# Patient Record
Sex: Female | Born: 1970 | Hispanic: Yes | Marital: Single | Smoking: Never smoker
Health system: Southern US, Community
[De-identification: ages and names within clinical notes are randomized; demographics above are authoritative.]

## PROBLEM LIST (undated history)

## (undated) ENCOUNTER — Inpatient Hospital Stay (HOSPITAL_COMMUNITY): Payer: Self-pay

## (undated) DIAGNOSIS — O039 Complete or unspecified spontaneous abortion without complication: Secondary | ICD-10-CM

## (undated) DIAGNOSIS — F329 Major depressive disorder, single episode, unspecified: Secondary | ICD-10-CM

## (undated) DIAGNOSIS — F32A Depression, unspecified: Secondary | ICD-10-CM

## (undated) HISTORY — PX: DILATION AND CURETTAGE OF UTERUS: SHX78

## (undated) HISTORY — PX: TUBAL LIGATION: SHX77

---

## 2008-10-03 ENCOUNTER — Ambulatory Visit: Payer: Self-pay | Admitting: Gynecology

## 2008-10-10 ENCOUNTER — Ambulatory Visit: Payer: Self-pay | Admitting: Gynecology

## 2008-10-23 ENCOUNTER — Ambulatory Visit: Payer: Self-pay | Admitting: Gynecology

## 2008-10-30 ENCOUNTER — Ambulatory Visit: Payer: Self-pay | Admitting: Gynecology

## 2009-02-20 ENCOUNTER — Inpatient Hospital Stay (HOSPITAL_COMMUNITY): Admission: AD | Admit: 2009-02-20 | Discharge: 2009-02-20 | Payer: Self-pay | Admitting: Obstetrics & Gynecology

## 2009-09-25 ENCOUNTER — Inpatient Hospital Stay (HOSPITAL_COMMUNITY): Admission: AD | Admit: 2009-09-25 | Discharge: 2009-09-25 | Payer: Self-pay | Admitting: Obstetrics

## 2009-10-12 ENCOUNTER — Inpatient Hospital Stay (HOSPITAL_COMMUNITY): Admission: RE | Admit: 2009-10-12 | Discharge: 2009-10-15 | Payer: Self-pay | Admitting: Obstetrics

## 2009-10-20 ENCOUNTER — Inpatient Hospital Stay (HOSPITAL_COMMUNITY): Admission: AD | Admit: 2009-10-20 | Discharge: 2009-10-20 | Payer: Self-pay | Admitting: Obstetrics

## 2010-11-24 LAB — CBC
HCT: 31.8 % — ABNORMAL LOW (ref 36.0–46.0)
MCHC: 32 g/dL (ref 30.0–36.0)
MCV: 77.5 fL — ABNORMAL LOW (ref 78.0–100.0)
Platelets: 331 10*3/uL (ref 150–400)
WBC: 13 10*3/uL — ABNORMAL HIGH (ref 4.0–10.5)

## 2010-11-24 LAB — URINALYSIS, ROUTINE W REFLEX MICROSCOPIC
Ketones, ur: 40 mg/dL — AB
Nitrite: NEGATIVE
Protein, ur: NEGATIVE mg/dL
Urobilinogen, UA: 0.2 mg/dL (ref 0.0–1.0)

## 2010-11-24 LAB — COMPREHENSIVE METABOLIC PANEL
Albumin: 2.4 g/dL — ABNORMAL LOW (ref 3.5–5.2)
BUN: 2 mg/dL — ABNORMAL LOW (ref 6–23)
CO2: 21 mEq/L (ref 19–32)
Calcium: 8.6 mg/dL (ref 8.4–10.5)
Chloride: 108 mEq/L (ref 96–112)
Creatinine, Ser: 0.44 mg/dL (ref 0.4–1.2)
GFR calc non Af Amer: >60 mL/min (ref >60)
Total Bilirubin: 0.7 mg/dL (ref 0.3–1.2)

## 2010-11-24 LAB — URIC ACID: Uric Acid, Serum: 3.4 mg/dL (ref 2.4–7.0)

## 2010-11-27 LAB — RPR: RPR Ser Ql: NONREACTIVE

## 2010-11-27 LAB — CBC
MCHC: 31.9 g/dL (ref 30.0–36.0)
Platelets: 416 10*3/uL — ABNORMAL HIGH (ref 150–400)
RBC: 3.89 MIL/uL (ref 3.87–5.11)
RDW: 16.7 % — ABNORMAL HIGH (ref 11.5–15.5)
WBC: 11.7 10*3/uL — ABNORMAL HIGH (ref 4.0–10.5)

## 2010-12-16 LAB — URINALYSIS, ROUTINE W REFLEX MICROSCOPIC
Glucose, UA: NEGATIVE mg/dL
Leukocytes, UA: NEGATIVE
Protein, ur: NEGATIVE mg/dL
pH: 6 (ref 5.0–8.0)

## 2010-12-16 LAB — WET PREP, GENITAL: Yeast Wet Prep HPF POC: NONE SEEN

## 2010-12-16 LAB — URINE MICROSCOPIC-ADD ON

## 2010-12-16 LAB — GC/CHLAMYDIA PROBE AMP, GENITAL: GC Probe Amp, Genital: NEGATIVE

## 2011-06-19 ENCOUNTER — Other Ambulatory Visit (HOSPITAL_COMMUNITY): Payer: Self-pay | Admitting: Obstetrics

## 2011-06-19 DIAGNOSIS — Z3689 Encounter for other specified antenatal screening: Secondary | ICD-10-CM

## 2011-06-19 DIAGNOSIS — O09529 Supervision of elderly multigravida, unspecified trimester: Secondary | ICD-10-CM

## 2011-06-25 ENCOUNTER — Ambulatory Visit (HOSPITAL_COMMUNITY)
Admission: RE | Admit: 2011-06-25 | Discharge: 2011-06-25 | Disposition: A | Discharge status: Home / Self Care | Payer: Self-pay | Source: Ambulatory Visit | Attending: Obstetrics | Admitting: Obstetrics

## 2011-06-25 ENCOUNTER — Encounter (HOSPITAL_COMMUNITY): Payer: Self-pay

## 2011-06-25 ENCOUNTER — Other Ambulatory Visit: Payer: Self-pay

## 2011-06-25 ENCOUNTER — Other Ambulatory Visit (HOSPITAL_COMMUNITY): Payer: Self-pay | Admitting: Obstetrics

## 2011-06-25 ENCOUNTER — Other Ambulatory Visit (HOSPITAL_COMMUNITY): Payer: Self-pay | Admitting: Obstetrics and Gynecology

## 2011-06-25 DIAGNOSIS — Z0489 Encounter for examination and observation for other specified reasons: Secondary | ICD-10-CM

## 2011-06-25 DIAGNOSIS — Z3689 Encounter for other specified antenatal screening: Secondary | ICD-10-CM

## 2011-06-25 DIAGNOSIS — O09529 Supervision of elderly multigravida, unspecified trimester: Secondary | ICD-10-CM | POA: Insufficient documentation

## 2011-06-25 DIAGNOSIS — Z8279 Family history of other congenital malformations, deformations and chromosomal abnormalities: Secondary | ICD-10-CM | POA: Insufficient documentation

## 2011-06-25 DIAGNOSIS — O34219 Maternal care for unspecified type scar from previous cesarean delivery: Secondary | ICD-10-CM | POA: Insufficient documentation

## 2011-06-25 NOTE — Progress Notes (Signed)
Ultrasound in AS/OBGYN/EPIC.  Follow up U/S scheduled 

## 2011-06-25 NOTE — Progress Notes (Signed)
Genetic Counseling  High-Risk Gestation Note  Appointment Date:  06/25/2011 Referred By: Kathreen Cosier, MD Date of Birth:  [birth year]  Pregnancy History: [id number] Estimated Date of Delivery: 12/13/11 Estimated Gestational Age: [redacted]w[redacted]d  [name] was seen for genetic counseling because of a maternal age of 40 y.o..  She was accompanied by two nieces, one of which provided Spanish/English interpretation. A medical language interpreter through [facility] was declined by the patient.   She was counseled regarding maternal age and the association with risk for chromosome conditions due to nondisjunction with aging of the ova.   We reviewed chromosomes, nondisjunction, and the associated 1 in  28 risk for fetal aneuploidy in the second trimester related to a maternal age of 74 at delivery.  She was counseled that the risk for aneuploidy decreases as gestational age increases, accounting for those pregnancies which spontaneously abort.  We specifically discussed Down syndrome (trisomy 33), trisomies 44 and 60, and sex chromosome aneuploidies (47,XXX and 47,XXY) including the common features and prognoses of each.   We reviewed available screening and diagnostic options.  Regarding screening tests, we discussed the options of Quad screen and ultrasound.  She understands that screening tests are used to modify a patient's a priori risk for aneuploidy, typically based on age.  This estimate provides a pregnancy specific risk assessment.  We also reviewed the availability of diagnostic option of amniocentesis. A risk of 1 in 200-300 was given for amniocentesis, the primary complication being spontaneous pregnancy loss.  We discussed the risks, limitations, and benefits of each.  After reviewing these options, [name] elected to have Quad screen at the time of today's visit and return for detailed ultrasound on 07/23/11, but declined amniocentesis at this time.  She wishes to  pursue these options to help ascertain her pregnancy specific risks for aneuploidy and will consider amniocentesis pending the results of this testing.  She understands that ultrasound cannot rule out all birth defects or genetic syndromes. The patient was advised of this limitation and states she still does not want diagnostic testing at this time.  However, she was counseled that 50-80% of fetuses with Down syndrome and up to 90% of fetuses with trisomies 13 and 18, when well visualized, have detectable anomalies or soft markers by ultrasound. An ultrasound was performed at the time of today's visit. Complete ultrasound results are reported separately.   [name] was provided with written information regarding sickle cell anemia (SCA) including the carrier frequency and incidence in the Hispanic population, the availability of carrier testing and prenatal diagnosis if indicated.  In addition, we discussed that hemoglobinopathies are routinely screened for as part of the [place] newborn screening panel.  She declined hemoglobin electrophoresis today.  Both family histories were reviewed and found to be contributory for the patient's nephew with Down syndrome (unspecified type). We discussed that 95% of cases of Down syndrome are not inherited and are the result of non-disjunction.  Three to 4% of cases of Down syndrome are the result of a translocation involving chromosome #21.  We discussed the option of chromosome analysis to determine if an individual is a carrier of a balanced translocation involving chromosome #21.  If an individual carries a balanced translocation involving chromosome #21, then the chance to have a baby with Down syndrome would be greater than the maternal age-related risk.   We discussed that given the reported family history, her nephew's Down syndrome was most likely sporadic.  However, additional information regarding his chromosome analysis is needed to accurately assess  recurrence risk for relatives.   Additionally, the patient reported a sister with a heart condition diagnosed at age 81 years described as an enlarged heart. She is otherwise healthy. The father of the pregnancy reportedly has a paternal half-brother with a heart condition at age 42. Very limited information was known regarding these individuals' conditions. We discussed that additional information is needed to assess recurrence risk for relatives. The family history is otherwise noncontributory for birth defects, mental retardation, and known genetic conditions. Without further information regarding the provided family history, an accurate genetic risk cannot be calculated. Further genetic counseling is warranted if more information is obtained.  [name]  denied exposure to environmental toxins or chemical agents. She denied the use of alcohol, tobacco or street drugs. She denied significant viral illnesses during the course of her pregnancy. Her medical and surgical histories were noncontributory.   I counseled [name]  regarding the above risks and available options.  The approximate face-to-face time with the genetic counselor was 40 minutes.  Clydie Braun Cornellius Kropp, MS, [facility] 06/25/2011

## 2011-07-08 ENCOUNTER — Telehealth (HOSPITAL_COMMUNITY): Payer: Self-pay | Admitting: MS"

## 2011-07-08 NOTE — Telephone Encounter (Signed)
Left message for patient to return call regarding results.   Quad screen performed on 06/25/11 is screen negative. Down syndrome risk reduced to 1 in 792 and Trisomy 18 risk reduced to 1 in 3,579. AFP is borderline elevated (2.07 MoM), technically screen negative. Patient has detailed ultrasound scheduled in [facility] on 07/23/11.

## 2011-07-23 ENCOUNTER — Ambulatory Visit (HOSPITAL_COMMUNITY)
Admission: RE | Admit: 2011-07-23 | Discharge: 2011-07-23 | Disposition: A | Discharge status: Home / Self Care | Payer: Self-pay | Source: Ambulatory Visit | Attending: Obstetrics | Admitting: Obstetrics

## 2011-07-23 DIAGNOSIS — O34219 Maternal care for unspecified type scar from previous cesarean delivery: Secondary | ICD-10-CM | POA: Insufficient documentation

## 2011-07-23 DIAGNOSIS — Z0489 Encounter for examination and observation for other specified reasons: Secondary | ICD-10-CM

## 2011-07-23 DIAGNOSIS — O09529 Supervision of elderly multigravida, unspecified trimester: Secondary | ICD-10-CM | POA: Insufficient documentation

## 2011-09-09 NOTE — L&D Delivery Note (Signed)
Requested to attend repeat C/S at term gestation with no reported risk factors. At birth AROM were clear and infant was manually extracted from vertex presentation. Spontaneous cries and active movement noted. Given tactile stimulation with drying and bulb suction to naso/oropharynx. No dysmorphic features. Infant made normal transition.  Eruption cyst noted on upper alveolar ridge in mouth.   Care transferred to RN from central nursery and to assigned pediatrician.   Dagoberto Ligas MD [facility]

## 2011-11-26 ENCOUNTER — Other Ambulatory Visit: Payer: Self-pay | Admitting: Obstetrics

## 2011-11-30 ENCOUNTER — Inpatient Hospital Stay (HOSPITAL_COMMUNITY)
Admission: AD | Admit: 2011-11-30 | Discharge: 2011-11-30 | Disposition: A | Discharge status: Hospice | Payer: Medicaid Other | Source: Ambulatory Visit | Attending: Obstetrics | Admitting: Obstetrics

## 2011-11-30 ENCOUNTER — Encounter (HOSPITAL_COMMUNITY): Payer: Self-pay | Admitting: *Deleted

## 2011-11-30 DIAGNOSIS — O34219 Maternal care for unspecified type scar from previous cesarean delivery: Secondary | ICD-10-CM

## 2011-11-30 DIAGNOSIS — O99891 Other specified diseases and conditions complicating pregnancy: Secondary | ICD-10-CM | POA: Insufficient documentation

## 2011-11-30 DIAGNOSIS — A084 Viral intestinal infection, unspecified: Secondary | ICD-10-CM

## 2011-11-30 DIAGNOSIS — A088 Other specified intestinal infections: Secondary | ICD-10-CM | POA: Insufficient documentation

## 2011-11-30 DIAGNOSIS — O09529 Supervision of elderly multigravida, unspecified trimester: Secondary | ICD-10-CM

## 2011-11-30 DIAGNOSIS — R197 Diarrhea, unspecified: Secondary | ICD-10-CM | POA: Insufficient documentation

## 2011-11-30 HISTORY — DX: Depression, unspecified: F32.A

## 2011-11-30 HISTORY — DX: Major depressive disorder, single episode, unspecified: F32.9

## 2011-11-30 LAB — URINALYSIS, ROUTINE W REFLEX MICROSCOPIC
Leukocytes, UA: NEGATIVE
Protein, ur: NEGATIVE mg/dL
Urobilinogen, UA: 0.2 mg/dL (ref 0.0–1.0)

## 2011-11-30 LAB — URINE MICROSCOPIC-ADD ON

## 2011-11-30 MED ORDER — DIPHENOXYLATE-ATROPINE 2.5-0.025 MG PO TABS
1.0000 | ORAL_TABLET | Freq: Once | ORAL | Status: AC
  Administered 2011-11-30: 1 via ORAL
  Filled 2011-11-30: qty 1

## 2011-11-30 NOTE — MAU Provider Note (Signed)
History     CSN: [id number]  Arrival date and time: 11/30/11 2014   First Provider Initiated Contact with Patient 11/30/11 2131      Chief Complaint  Patient presents with  . Diarrhea   HPI This is a 41 y.o. at [redacted]w[redacted]d who presents with c/o diarrhea since this morning. Had one episode of vomiting this morning but none since. States her kids had it last week.   Has irregular contractions but not painful. No leaking or bleeding. Has C/S scheduled for next week.    OB History    Grav Para Term Preterm Abortions TAB SAB Ect Mult Living   5 2 2  0 2 0 2 0 0 2      Past Medical History  Diagnosis Date  . Depression     Past Surgical History  Procedure Date  . Cesarean section     Family History  Problem Relation Age of Onset  . Down syndrome Other     Lives in [place]; unknown which type of Down syndrome  . Heart disease Sister     History  Substance Use Topics  . Smoking status: Never Smoker   . Smokeless tobacco: Not on file  . Alcohol Use: No    Allergies: No Known Allergies  Prescriptions prior to admission  Medication Sig Dispense Refill  . Prenatal Vit-Fe Fumarate-FA (PRENATAL MULTIVITAMIN) TABS Take 1 tablet by mouth every morning.        Review of Systems  Constitutional: Positive for fever and malaise/fatigue.  Gastrointestinal: Positive for nausea and diarrhea. Negative for vomiting and constipation.    Physical Exam   Blood pressure 108/60, pulse 105, temperature 100.3 F (37.9 C), resp. rate 18, height 5\' 2"  (1.575 m), weight 174 lb (78.926 kg), last menstrual period 03/08/2011.  Physical Exam  Constitutional: She is oriented to person, place, and time. She appears well-developed and well-nourished. No distress.  HENT:  Head: Normocephalic.  Cardiovascular: Normal rate.   Respiratory: Effort normal.  GI: Soft. She exhibits no distension and no mass. There is tenderness (slightly tender diffusely). There is no rebound and no guarding.    Genitourinary: Vagina normal. No vaginal discharge found.       Cervix closed/70%/-3/vtx   Musculoskeletal: Normal range of motion.  Neurological: She is alert and oriented to person, place, and time.  Skin: Skin is warm and dry.  Psychiatric: She has a normal mood and affect.   FHR baseline 145 with accels to 160s.  No decels. Average variability UCs every 7-12 minutes  Results for orders placed during the hospital encounter of 11/30/11 (from the past 24 hour(s))  URINALYSIS, ROUTINE W REFLEX MICROSCOPIC     Status: Abnormal   Collection Time   11/30/11  8:37 PM      Component Value Range   Color, Urine YELLOW  YELLOW    APPearance HAZY (*) CLEAR    Specific Gravity, Urine <1.005 (*) 1.005 - 1.030    pH 5.5  5.0 - 8.0    Glucose, UA NEGATIVE  NEGATIVE (mg/dL)   Hgb urine dipstick TRACE (*) NEGATIVE    Bilirubin Urine NEGATIVE  NEGATIVE    Ketones, ur NEGATIVE  NEGATIVE (mg/dL)   Protein, ur NEGATIVE  NEGATIVE (mg/dL)   Urobilinogen, UA 0.2  0.0 - 1.0 (mg/dL)   Nitrite NEGATIVE  NEGATIVE    Leukocytes, UA NEGATIVE  NEGATIVE   URINE MICROSCOPIC-ADD ON     Status: Abnormal   Collection Time   11/30/11  8:37 PM      Component Value Range   Squamous Epithelial / LPF FEW (*) RARE    Bacteria, UA RARE  RARE     MAU Course  Procedures  MDM Consulted Dr Tamela Oddi.  Will give one dose of Lomotil for patient comfort, but told her I do not recommend ongoing doses as virus needs to get out of her system. Labor precautions given. Will d/c home since pt is well hydrated and able to take POs.   Assessment and Plan  A:  IUP at [redacted]w[redacted]d      Previous C/s, plans repeat next week      Gastroenteritis, likely viral P:  Will d/c home after Lomotil      Supportive care at home      Labor precautions. Come back if increased pain, bleeding, leakage, decreased fetal movement.   [facility] 11/30/2011, 9:44 PM

## 2011-11-30 NOTE — MAU Note (Signed)
Per interpreter pt presents with complaint of diarrhea x 2 days, vomited x 1 this am. Her children were sick with same last week. States she was having some lower abd pain but that has gone away.

## 2011-11-30 NOTE — Discharge Instructions (Signed)
Gastroenteritis viral (Viral Gastroenteritis) La gastroenteritis viral tambin es conocida como gripe del [place]. Este trastorno [employer] y el tubo digestivo. Puede causar diarrea y vmitos repentinos. La enfermedad generalmente dura entre 3 y [address]. La [employer]son de las personas desarrolla una respuesta inmunolgica. Con el tiempo, esto elimina el virus. Mientras se desarrolla esta respuesta natural, el virus puede afectar en forma importante su salud.  CAUSAS Muchos virus diferentes pueden causar gastroenteritis, por ejemplo el rotavirus o el norovirus. Estos virus pueden contagiarse al consumir alimentos o agua contaminados. Tambin puede contagiarse al compartir utensilios u otros artculos personales con una persona infectada o al tocar una superficie contaminada.  SNTOMAS Los sntomas ms comunes son diarrea y vmitos. Estos problemas pueden causar una prdida grave de lquidos corporales(deshidratacin) y un desequilibrio de sales corporales(electrolitos). Otros sntomas pueden ser:   Grant Ruts.   Dolor de [place].   Fatiga.   Dolor abdominal.  DIAGNSTICO  El mdico podr hacer el diagnstico de gastroenteritis viral basndose en los sntomas y el examen fsico Tambin pueden tomarle una muestra de materia fecal para diagnosticar la presencia de virus u otras infecciones.  TRATAMIENTO Esta enfermedad generalmente desaparece sin tratamiento. Los tratamientos estn dirigidos a Social research officer, government. Los casos ms graves de gastroenteritis viral implican vmitos tan intensos que no es posible retener lquidos. En [employer], los lquidos deben administrarse a travs de una va intravenosa (IV).  INSTRUCCIONES PARA EL CUIDADO DOMICILIARIO  Beba suficientes lquidos para mantener la orina clara o de color amarillo plido. Beba pequeas cantidades de lquido con frecuencia y aumente la cantidad segn la tolerancia.   Pida instrucciones especficas a su mdico con respecto a la  rehidratacin.   Evite:   Alimentos que Nurse, adult.   Alcohol.   Gaseosas.   TabacoVista Lawman.   Bebidas con cafena.   Lquidos muy calientes o fros.   Alimentos muy grasos.   Comer demasiado a Licensed conveyancer.   Productos lcteos hasta 24 a 48 horas despus de que se detenga la diarrea.   Puede consumir probiticos. Los probiticos son cultivos activos de bacterias beneficiosas. Pueden disminuir la cantidad y el nmero de deposiciones diarreicas en el adulto. Se encuentran en los yogures con cultivos activos y en los suplementos.   Lave bien sus manos para evitar que se disemine el virus.   Slo tome medicamentos de venta libre o recetados para Primary school teacher, las molestias o bajar la fiebre segn las indicaciones de su mdico. No administre aspirina a los nios. Los medicamentos antidiarreicos no son recomendables.   Consulte a su mdico si puede seguir tomando sus medicamentos recetados o de [employer].   Cumpla con todas las visitas de control, segn le indique su mdico.  SOLICITE ATENCIN MDICA DE INMEDIATO SI:  No puede retener lquidos.   No hay emisin de orina durante 6 a 8 horas.   Le falta el aire.   Observa sangre en el vmito (se ve como caf molido) o en la materia fecal.   Siente dolor abdominal que empeora o se concentra en una zona pequea (se localiza).   Tiene nuseas o vmitos persistentes.   Tiene fiebre.   El paciente es un nio menor de 3 meses y [place].   El paciente es un nio mayor de 3 meses, tiene fiebre y sntomas persistentes.   El paciente es un nio mayor de 3 meses y tiene fiebre y sntomas que empeoran repentinamente.   El [place] es  un beb y no tiene lgrimas cuando llora.  ASEGRESE QUE:   Comprende estas instrucciones.   Controlar su enfermedad.   Solicitar ayuda inmediatamente si no mejora o si empeora.  Document Released: 08/25/2005 Document Revised: 08/14/2011 [facility] Patient Information 2012  [place], [place].Parto por cesrea  (Cesarean Delivery) El parto por cesrea es el nacimiento de un feto a travs de un corte (incisin) en el vientre (abdomen) y en el matriz (tero).  HGALE SABER A SU MDICO SOBRE:   Complicaciones del embarazo.   Alergias.   Medicamentos que [place], incluyendo hierbas, gotas oftlmicas, medicamentos de [place] y Control and instrumentation engineer.   Uso de corticoides (por va oral o cremas).   Problemas anteriores debido a anestsicos o a medicamentos que [employer] sensibilidad.   Cirugas anteriores.   Historia de cogulos sanguneos   Problemas hemorrgicos o en la sangre.   Otros problemas de [place].  RIESGOS Y COMPLICACIONES  Hemorragias.   Infeccin.   Cogulos sanguneos.   Lesin en los rganos circundantes.   Problemas con la anestesia.   Lesiones al beb.  ANTES DEL PROCEDIMIENTO   Le insertarn un tubo (catter [employer] vejiga. El catter Foley drena la orina desde la vejiga hacia [place]. Esto mantendr la vejiga vaca durante la [place].   Le colocarn un catter de acceso intravenoso (IV) en el brazo.   Luego rasurarn la zona del pubis y la parte inferior del abdomen. Esto se realiza para evitar una infeccin en el sitio de la incisin.   Le administrarn un medicamento anticido. Esto impedir que los contenidos cidos del estmago ingresen a los pulmones si vomita durante el procedimiento.   Le podrn dar antibiticos para prevenir infecciones.  PROCEDIMIENTO   Le administrarn un medicamento para adormecer a zona inferior del cuerpo anestesia regional). Si estuviera en trabajo de parto, podrn aplicarle una anestesia epidural, que se utiliza tanto el el [place] de parto como en la cesrea. Puede ser [employer] administren un medicamento que la har dormir (anestesia general), aunque esto no es tan frecuente.   Le harn una incisin en el abdomen que se extiende [employer] and Company. Hay dos tipos bsicos de incisin:   La incisin  horizontal (transversa) Las incisiones horizontales se Engineer, maintenance mayor parte de las cesreas de [place].   La incisin vertical (de Jolene Schimke). Esta es la menos frecuente. Se reserva para aquellas mujeres que tienen una complicacin grave (parto prematuro extremo) o bajo situaciones de Associate Professor.   Las incisiones horizontales y Garment/textile technologist pueden utilizarse ambas al [employer]. Sin embargo, no es Air traffic controller.   El beb nacer.  DESPUS DEL PROCEDIMIENTO   Si estuvo despierta durante la [place], podr ver al beb enseguida. Si la duermen, ver al beb tan pronto como despierte.   Podr amamantar a su beb despus del procedimiento.   Podr levantarse y Therapist, art mismo da de la [place]. Si Office manager en cama durante cierto tiempo, recibir ayuda para darse vuelta, toser y Industrial/product designer profundamente despus de la ciruga. Esto ayuda a Environmental health practitioner en los pulmones, como la neumona.   No se levante de la cama sola la primera vez luego de la [place]. Necesitar ayuda para levantarse de la cama hasta que pueda hacerlo sola.   Podr darse Shaune Spittle el da siguiente a la [place]. Despus que le quiten el vendaje, un enfermero la ayudar a ducharse.   Tendr unas medias compresivas neumticas en los pies o en la zona inferior de las  piernas. Se colocan para prevenir cogulos sanguneos. Cuando se levante y camine regularmente, ya no sern necesarias.   Nocruce las piernas al sentarse.   Si elimina cogulos de sangre, gurdelos. Si elimina un cogulo cuando va al bao, por favor no tire la cadena. Llame al enfermero. Comunquele al enfermero si piensa que tiene demasiada hemorragia o que elimina muchos cogulos.   Comience a beber lquidos y a alimentarse segn las indicaciones del mdico. Si el estmago no est en condiciones,comer y beber demasiado pronto puede causar aumento de la hinchazn y la inflamacin del intestino o el abdomen. Esto es [employer].   Le  darn medicamentos si los necesita. Dgale a los profesionales si siente dolor. Ellos tratarn de que se sienta cmoda. Tambin le indicarn antibiticos para prevenir una infeccin.   Le quitarn la va intravenosa cuando beba una cantidad razonable de lquido. El catter Foley se retirar cuando se levante y camine.   Si su tipo sanguneo es Rh negativo y el beb es Rh positivo, le darn una inyeccin de inmunoblobulina anti D. Esta inyeccin evita que tenga problemas con el Rh en embarazos futuros. Deber colocarse la inyeccin an si se ha Production assistant, radio las trompas.   Si le permiten llevar al beb a dar un paseo,colquelo en la cunita y empjela. No lleve al beb en sus brazos.  Document Released: 08/25/2005 Document Revised: 08/14/2011 [facility] Patient Information 2012 [place], [place].

## 2011-12-01 ENCOUNTER — Encounter (HOSPITAL_COMMUNITY): Payer: Self-pay

## 2011-12-02 ENCOUNTER — Encounter (HOSPITAL_COMMUNITY): Payer: Self-pay | Admitting: Pharmacy Technician

## 2011-12-02 ENCOUNTER — Encounter (HOSPITAL_COMMUNITY): Payer: Self-pay

## 2011-12-02 ENCOUNTER — Encounter (HOSPITAL_COMMUNITY)
Admission: RE | Admit: 2011-12-02 | Discharge: 2011-12-02 | Disposition: A | Discharge status: Home / Self Care | Payer: Medicaid Other | Source: Ambulatory Visit | Attending: Obstetrics | Admitting: Obstetrics

## 2011-12-02 VITALS — BP 105/50 | HR 63 | Ht 62.0 in | Wt 174.0 lb

## 2011-12-02 DIAGNOSIS — O09529 Supervision of elderly multigravida, unspecified trimester: Secondary | ICD-10-CM

## 2011-12-02 DIAGNOSIS — O34219 Maternal care for unspecified type scar from previous cesarean delivery: Secondary | ICD-10-CM

## 2011-12-02 HISTORY — DX: Complete or unspecified spontaneous abortion without complication: O03.9

## 2011-12-02 LAB — CBC
HCT: 38.6 % (ref 36.0–46.0)
Hemoglobin: 13 g/dL (ref 12.0–15.0)
MCH: 28.6 pg (ref 26.0–34.0)
MCV: 85 fL (ref 78.0–100.0)
RBC: 4.54 MIL/uL (ref 3.87–5.11)
WBC: 7.8 10*3/uL (ref 4.0–10.5)

## 2011-12-02 LAB — SURGICAL PCR SCREEN
MRSA, PCR: NEGATIVE
Staphylococcus aureus: NEGATIVE

## 2011-12-02 NOTE — Patient Instructions (Addendum)
   Your procedure is scheduled on:  Wednesday, April 3rd  Enter through the [employer] of [facility] at:  [employer] up the phone at the desk and dial [phone] and inform us of your arrival.  Please call this number if you have any problems the morning of surgery: [phone]  Remember: Do not eat food after midnight: Tuesday Do not drink clear liquids after:  Tuesday Take these medicines the morning of surgery with a SIP OF WATER:  None  Do not wear jewelry, make-up, or FINGER nail polish Do not wear lotions, powders, perfumes or deodorant. Do not shave 48 hours prior to surgery. Do not bring valuables to the hospital. Contacts, dentures or bridgework may not be worn into surgery.  Leave suitcase in the car. After Surgery it may be brought to your room. For patients being admitted to the hospital, checkout time is 11:00am the day of discharge.  Patients discharged on the day of surgery will not be allowed to drive home.  Home with Dante Gang   Remember to use your hibiclens as instructed.Please shower with 1/2 bottle the evening before your surgery and the other 1/2 bottle the morning of surgery. Neck down avoiding private area.

## 2011-12-02 NOTE — Pre-Procedure Instructions (Signed)
Eda Royal, Spanish Interpreter used at [employer].

## 2011-12-05 NOTE — H&P (Unsigned)
NAME:  FANY, CAVANAUGH NO.:  [id number]  MEDICAL RECORD NO.:  [id number]  LOCATION:  PERIO                         FACILITY:  [facility]  PHYSICIAN:  Kathreen Cosier, M.D.DATE OF BIRTH:  07-05-1971  DATE OF ADMISSION:  11/06/2011 DATE OF DISCHARGE:                             HISTORY & PHYSICAL   The patient is a 41 year old, gravida 5, para 2-0-2-2, who has a history of 2 previous C-sections.  Her EDC is December 13, 2011, and she is now admitted for repeat C-section and tubal ligation,.  PAST MEDICAL HISTORY:  She has a history of depression, on Zoloft 50 mg daily.  PAST SURGICAL HISTORY:  Two C-sections.  SYSTEM REVIEW:  Negative.  PHYSICAL EXAMINATION:  GENERAL:  A well-developed female, in no distress. HEENT:  Negative. BREASTS:  Engorged. LUNGS:  Clear. HEART:  Regular rhythm.  No murmurs, no gallops. ABDOMEN:  Term. PELVIC:  Cervix long and closed. EXTREMITIES:  Negative.          ______________________________ Kathreen Cosier, M.D.     BAM/MEDQ  D:  12/04/2011  T:  12/05/2011  Job:  [id number]

## 2011-12-10 ENCOUNTER — Encounter (HOSPITAL_COMMUNITY)
Admission: RE | Disposition: A | Discharge status: Home / Self Care | Payer: Self-pay | Source: Ambulatory Visit | Attending: Obstetrics

## 2011-12-10 ENCOUNTER — Inpatient Hospital Stay (HOSPITAL_COMMUNITY): Payer: Medicaid Other | Admitting: Anesthesiology

## 2011-12-10 ENCOUNTER — Inpatient Hospital Stay (HOSPITAL_COMMUNITY)
Admission: RE | Admit: 2011-12-10 | Discharge: 2011-12-13 | DRG: 766 | Disposition: A | Discharge status: Home / Self Care | Payer: Medicaid Other | Source: Ambulatory Visit | Attending: Obstetrics | Admitting: Obstetrics

## 2011-12-10 ENCOUNTER — Encounter (HOSPITAL_COMMUNITY): Payer: Self-pay | Admitting: *Deleted

## 2011-12-10 ENCOUNTER — Encounter (HOSPITAL_COMMUNITY): Payer: Self-pay | Admitting: Anesthesiology

## 2011-12-10 DIAGNOSIS — Z302 Encounter for sterilization: Secondary | ICD-10-CM

## 2011-12-10 DIAGNOSIS — O34219 Maternal care for unspecified type scar from previous cesarean delivery: Principal | ICD-10-CM | POA: Diagnosis present

## 2011-12-10 DIAGNOSIS — Z8279 Family history of other congenital malformations, deformations and chromosomal abnormalities: Secondary | ICD-10-CM

## 2011-12-10 DIAGNOSIS — Z98891 History of uterine scar from previous surgery: Secondary | ICD-10-CM

## 2011-12-10 DIAGNOSIS — O09529 Supervision of elderly multigravida, unspecified trimester: Secondary | ICD-10-CM

## 2011-12-10 LAB — TYPE AND SCREEN
ABO/RH(D): A POS
Antibody Screen: NEGATIVE

## 2011-12-10 SURGERY — Surgical Case
Anesthesia: Spinal | Laterality: Bilateral

## 2011-12-10 MED ORDER — SENNOSIDES-DOCUSATE SODIUM 8.6-50 MG PO TABS
2.0000 | ORAL_TABLET | Freq: Every day | ORAL | Status: DC
  Administered 2011-12-10 – 2011-12-12 (×3): 2 via ORAL

## 2011-12-10 MED ORDER — ONDANSETRON HCL 4 MG/2ML IJ SOLN: 4.0000 mg | Freq: Three times a day (TID) | INTRAMUSCULAR | Status: DC | PRN

## 2011-12-10 MED ORDER — KETOROLAC TROMETHAMINE 30 MG/ML IJ SOLN: 15.0000 mg | Freq: Once | INTRAMUSCULAR | Status: DC | PRN

## 2011-12-10 MED ORDER — LANOLIN HYDROUS EX OINT: 1.0000 "application " | TOPICAL_OINTMENT | CUTANEOUS | Status: DC | PRN

## 2011-12-10 MED ORDER — ONDANSETRON HCL 4 MG PO TABS: 4.0000 mg | ORAL_TABLET | ORAL | Status: DC | PRN

## 2011-12-10 MED ORDER — OXYTOCIN 10 UNIT/ML IJ SOLN
INTRAMUSCULAR | Status: AC
  Filled 2011-12-10: qty 2

## 2011-12-10 MED ORDER — KETOROLAC TROMETHAMINE 30 MG/ML IJ SOLN
INTRAMUSCULAR | Status: AC
  Administered 2011-12-10: 30 mg via INTRAVENOUS
  Filled 2011-12-10: qty 1

## 2011-12-10 MED ORDER — MORPHINE SULFATE 0.5 MG/ML IJ SOLN
INTRAMUSCULAR | Status: AC
  Filled 2011-12-10: qty 10

## 2011-12-10 MED ORDER — SCOPOLAMINE 1 MG/3DAYS TD PT72
1.0000 | MEDICATED_PATCH | Freq: Once | TRANSDERMAL | Status: DC
  Administered 2011-12-10: 1.5 mg via TRANSDERMAL

## 2011-12-10 MED ORDER — SIMETHICONE 80 MG PO CHEW
80.0000 mg | CHEWABLE_TABLET | Freq: Three times a day (TID) | ORAL | Status: DC
  Administered 2011-12-10 – 2011-12-13 (×10): 80 mg via ORAL

## 2011-12-10 MED ORDER — OXYTOCIN 20 UNITS IN LACTATED RINGERS INFUSION - SIMPLE
INTRAVENOUS | Status: AC
  Administered 2011-12-10: 125 mL/h via INTRAVENOUS
  Filled 2011-12-10: qty 1000

## 2011-12-10 MED ORDER — OXYCODONE-ACETAMINOPHEN 5-325 MG PO TABS
1.0000 | ORAL_TABLET | ORAL | Status: DC | PRN
  Administered 2011-12-11 – 2011-12-12 (×7): 1 via ORAL
  Administered 2011-12-12: 2 via ORAL
  Administered 2011-12-12: 1 via ORAL
  Administered 2011-12-13: 2 via ORAL
  Administered 2011-12-13: 1 via ORAL
  Filled 2011-12-10: qty 1
  Filled 2011-12-10: qty 2
  Filled 2011-12-10 (×6): qty 1
  Filled 2011-12-10: qty 2
  Filled 2011-12-10: qty 1
  Filled 2011-12-10: qty 2

## 2011-12-10 MED ORDER — EPHEDRINE SULFATE 50 MG/ML IJ SOLN
INTRAMUSCULAR | Status: DC | PRN
  Administered 2011-12-10: 10 mg via INTRAVENOUS

## 2011-12-10 MED ORDER — MEPERIDINE HCL 25 MG/ML IJ SOLN: 6.2500 mg | INTRAMUSCULAR | Status: DC | PRN

## 2011-12-10 MED ORDER — PRENATAL MULTIVITAMIN CH
1.0000 | ORAL_TABLET | Freq: Every day | ORAL | Status: DC
  Administered 2011-12-11 – 2011-12-13 (×3): 1 via ORAL
  Filled 2011-12-10 (×3): qty 1

## 2011-12-10 MED ORDER — NALOXONE HCL 0.4 MG/ML IJ SOLN: 0.4000 mg | INTRAMUSCULAR | Status: DC | PRN

## 2011-12-10 MED ORDER — ONDANSETRON HCL 4 MG/2ML IJ SOLN
INTRAMUSCULAR | Status: AC
  Filled 2011-12-10: qty 2

## 2011-12-10 MED ORDER — ZOLPIDEM TARTRATE 5 MG PO TABS: 5.0000 mg | ORAL_TABLET | Freq: Every evening | ORAL | Status: DC | PRN

## 2011-12-10 MED ORDER — KETOROLAC TROMETHAMINE 30 MG/ML IJ SOLN: 30.0000 mg | Freq: Four times a day (QID) | INTRAMUSCULAR | Status: AC | PRN

## 2011-12-10 MED ORDER — WITCH HAZEL-GLYCERIN EX PADS: 1.0000 "application " | MEDICATED_PAD | CUTANEOUS | Status: DC | PRN

## 2011-12-10 MED ORDER — CEFAZOLIN SODIUM 1-5 GM-% IV SOLN
1.0000 g | Freq: Once | INTRAVENOUS | Status: AC
  Administered 2011-12-10: 1 g via INTRAVENOUS

## 2011-12-10 MED ORDER — IBUPROFEN 600 MG PO TABS
600.0000 mg | ORAL_TABLET | Freq: Four times a day (QID) | ORAL | Status: DC
  Administered 2011-12-10 – 2011-12-13 (×11): 600 mg via ORAL
  Filled 2011-12-10 (×11): qty 1

## 2011-12-10 MED ORDER — NALBUPHINE HCL 10 MG/ML IJ SOLN
5.0000 mg | INTRAMUSCULAR | Status: DC | PRN
  Administered 2011-12-10: 10 mg via INTRAVENOUS
  Filled 2011-12-10 (×3): qty 1

## 2011-12-10 MED ORDER — PROMETHAZINE HCL 25 MG/ML IJ SOLN: 6.2500 mg | INTRAMUSCULAR | Status: DC | PRN

## 2011-12-10 MED ORDER — FENTANYL CITRATE 0.05 MG/ML IJ SOLN
INTRAMUSCULAR | Status: AC
  Filled 2011-12-10: qty 2

## 2011-12-10 MED ORDER — SODIUM CHLORIDE 0.9 % IJ SOLN: 3.0000 mL | INTRAMUSCULAR | Status: DC | PRN

## 2011-12-10 MED ORDER — ONDANSETRON HCL 4 MG/2ML IJ SOLN
INTRAMUSCULAR | Status: DC | PRN
  Administered 2011-12-10: 4 mg via INTRAVENOUS

## 2011-12-10 MED ORDER — OXYTOCIN 20 UNITS IN LACTATED RINGERS INFUSION - SIMPLE
125.0000 mL/h | INTRAVENOUS | Status: AC
  Administered 2011-12-10: 125 mL/h via INTRAVENOUS

## 2011-12-10 MED ORDER — LACTATED RINGERS IV SOLN
INTRAVENOUS | Status: DC
  Administered 2011-12-10 (×2): via INTRAVENOUS

## 2011-12-10 MED ORDER — DIPHENHYDRAMINE HCL 25 MG PO CAPS
25.0000 mg | ORAL_CAPSULE | ORAL | Status: DC | PRN
  Administered 2011-12-11: 25 mg via ORAL
  Filled 2011-12-10 (×2): qty 1

## 2011-12-10 MED ORDER — EPHEDRINE 5 MG/ML INJ
INTRAVENOUS | Status: AC
  Filled 2011-12-10: qty 10

## 2011-12-10 MED ORDER — TETANUS-DIPHTH-ACELL PERTUSSIS 5-2.5-18.5 LF-MCG/0.5 IM SUSP
0.5000 mL | Freq: Once | INTRAMUSCULAR | Status: AC
  Administered 2011-12-11: 0.5 mL via INTRAMUSCULAR
  Filled 2011-12-10: qty 0.5

## 2011-12-10 MED ORDER — ONDANSETRON HCL 4 MG/2ML IJ SOLN: 4.0000 mg | INTRAMUSCULAR | Status: DC | PRN

## 2011-12-10 MED ORDER — NALBUPHINE HCL 10 MG/ML IJ SOLN
5.0000 mg | INTRAMUSCULAR | Status: DC | PRN
  Administered 2011-12-10 – 2011-12-11 (×2): 10 mg via SUBCUTANEOUS
  Filled 2011-12-10 (×2): qty 1

## 2011-12-10 MED ORDER — KETOROLAC TROMETHAMINE 60 MG/2ML IM SOLN
60.0000 mg | Freq: Once | INTRAMUSCULAR | Status: AC | PRN
  Administered 2011-12-10: 60 mg via INTRAMUSCULAR
  Filled 2011-12-10: qty 2

## 2011-12-10 MED ORDER — DIPHENHYDRAMINE HCL 50 MG/ML IJ SOLN: 12.5000 mg | INTRAMUSCULAR | Status: DC | PRN

## 2011-12-10 MED ORDER — SIMETHICONE 80 MG PO CHEW
80.0000 mg | CHEWABLE_TABLET | ORAL | Status: DC | PRN
  Administered 2011-12-12: 80 mg via ORAL

## 2011-12-10 MED ORDER — DIPHENHYDRAMINE HCL 50 MG/ML IJ SOLN: 25.0000 mg | INTRAMUSCULAR | Status: DC | PRN

## 2011-12-10 MED ORDER — OXYTOCIN 20 UNITS IN LACTATED RINGERS INFUSION - SIMPLE
INTRAVENOUS | Status: DC | PRN
  Administered 2011-12-10: 20 [IU] via INTRAVENOUS

## 2011-12-10 MED ORDER — LACTATED RINGERS IV SOLN
INTRAVENOUS | Status: DC
  Administered 2011-12-10: 17:00:00 via INTRAVENOUS

## 2011-12-10 MED ORDER — MENTHOL 3 MG MT LOZG: 1.0000 | LOZENGE | OROMUCOSAL | Status: DC | PRN

## 2011-12-10 MED ORDER — SODIUM CHLORIDE 0.9 % IV SOLN
1.0000 ug/kg/h | INTRAVENOUS | Status: DC | PRN
  Filled 2011-12-10: qty 2.5

## 2011-12-10 MED ORDER — HYDROMORPHONE HCL PF 1 MG/ML IJ SOLN: 0.2500 mg | INTRAMUSCULAR | Status: DC | PRN

## 2011-12-10 MED ORDER — KETOROLAC TROMETHAMINE 30 MG/ML IJ SOLN
30.0000 mg | Freq: Four times a day (QID) | INTRAMUSCULAR | Status: AC | PRN
  Administered 2011-12-10: 30 mg via INTRAVENOUS

## 2011-12-10 MED ORDER — DIBUCAINE 1 % RE OINT: 1.0000 "application " | TOPICAL_OINTMENT | RECTAL | Status: DC | PRN

## 2011-12-10 MED ORDER — DIPHENHYDRAMINE HCL 25 MG PO CAPS: 25.0000 mg | ORAL_CAPSULE | Freq: Four times a day (QID) | ORAL | Status: DC | PRN

## 2011-12-10 SURGICAL SUPPLY — 27 items
CLOTH BEACON ORANGE TIMEOUT ST (SAFETY) ×2 IMPLANT
CONTAINER PREFILL 10% NBF 15ML (MISCELLANEOUS) ×4 IMPLANT
DERMABOND ADVANCED (GAUZE/BANDAGES/DRESSINGS) ×1
DERMABOND ADVANCED .7 DNX12 (GAUZE/BANDAGES/DRESSINGS) ×1 IMPLANT
ELECT REM PT RETURN 9FT ADLT (ELECTROSURGICAL) ×2
ELECTRODE REM PT RTRN 9FT ADLT (ELECTROSURGICAL) ×1 IMPLANT
EXTRACTOR VACUUM M CUP 4 TUBE (SUCTIONS) IMPLANT
GLOVE BIO SURGEON STRL SZ8.5 (GLOVE) ×4 IMPLANT
GOWN PREVENTION PLUS LG XLONG (DISPOSABLE) ×4 IMPLANT
GOWN PREVENTION PLUS XXLARGE (GOWN DISPOSABLE) ×2 IMPLANT
KIT ABG SYR 3ML LUER SLIP (SYRINGE) IMPLANT
NEEDLE HYPO 25X5/8 SAFETYGLIDE (NEEDLE) IMPLANT
NS IRRIG 1000ML POUR BTL (IV SOLUTION) ×2 IMPLANT
PACK C SECTION WH (CUSTOM PROCEDURE TRAY) ×2 IMPLANT
SLEEVE SCD COMPRESS KNEE MED (MISCELLANEOUS) IMPLANT
SUT CHROMIC 0 CT 802H (SUTURE) ×2 IMPLANT
SUT CHROMIC 1 CTX 36 (SUTURE) ×4 IMPLANT
SUT CHROMIC 2 0 SH (SUTURE) ×2 IMPLANT
SUT GUT PLAIN 0 CT-3 TAN 27 (SUTURE) ×2 IMPLANT
SUT MON AB 4-0 PS1 27 (SUTURE) ×2 IMPLANT
SUT VIC AB 0 CT1 18XCR BRD8 (SUTURE) IMPLANT
SUT VIC AB 0 CT1 8-18 (SUTURE)
SUT VIC AB 0 CTX 36 (SUTURE) ×2
SUT VIC AB 0 CTX36XBRD ANBCTRL (SUTURE) ×2 IMPLANT
TOWEL OR 17X24 6PK STRL BLUE (TOWEL DISPOSABLE) ×4 IMPLANT
TRAY FOLEY CATH 14FR (SET/KITS/TRAYS/PACK) ×2 IMPLANT
WATER STERILE IRR 1000ML POUR (IV SOLUTION) ×2 IMPLANT

## 2011-12-10 NOTE — Anesthesia Preprocedure Evaluation (Signed)
Anesthesia Evaluation  Patient identified by MRN, date of birth, ID band Patient awake    Reviewed: Allergy & Precautions, H&P , NPO status , Patient's Chart, lab work & pertinent test results  Airway Mallampati: II TM Distance: >3 FB Neck ROM: full    Dental No notable dental hx.    Pulmonary neg pulmonary ROS,    Pulmonary exam normal       Cardiovascular negative cardio ROS  Rate:Normal     Neuro/Psych PSYCHIATRIC DISORDERS Depression negative neurological ROS     GI/Hepatic negative GI ROS, Neg liver ROS,   Endo/Other  negative endocrine ROS  Renal/GU negative Renal ROS  negative genitourinary   Musculoskeletal negative musculoskeletal ROS (+)   Abdominal Normal abdominal exam  (+)   Peds  Hematology negative hematology ROS (+)   Anesthesia Other Findings   Reproductive/Obstetrics (+) Pregnancy                           Anesthesia Physical Anesthesia Plan  ASA: II  Anesthesia Plan: Spinal   Post-op Pain Management:    Induction:   Airway Management Planned:   Additional Equipment:   Intra-op Plan:   Post-operative Plan:   Informed Consent: I have reviewed the patients History and Physical, chart, labs and discussed the procedure including the risks, benefits and alternatives for the proposed anesthesia with the patient or authorized representative who has indicated his/her understanding and acceptance.     Plan Discussed with: Surgeon and CRNA  Anesthesia Plan Comments:         Anesthesia Quick Evaluation

## 2011-12-10 NOTE — Anesthesia Postprocedure Evaluation (Signed)
  Anesthesia Post Note  Patient: Leslie Mcdowell  Procedure(s) Performed: Procedure(s) (LRB): CESAREAN SECTION WITH BILATERAL TUBAL LIGATION (Bilateral)  Anesthesia type: Spinal  Patient location: Mother/Baby  Post pain: Pain level controlled  Post assessment: Post-op Vital signs reviewed  Last Vitals:  Filed Vitals:   12/10/11 1539  BP: 99/64  Pulse: 79  Temp:   Resp: 18    Post vital signs: Reviewed  Level of consciousness: awake  Complications: No apparent anesthesia complications

## 2011-12-10 NOTE — Transfer of Care (Signed)
Immediate Anesthesia Transfer of Care Note  Patient: Leslie Mcdowell  Procedure(s) Performed: Procedure(s) (LRB): CESAREAN SECTION WITH BILATERAL TUBAL LIGATION (Bilateral)  Patient Location: PACU  Anesthesia Type: Spinal  Level of Consciousness: awake  Airway & Oxygen Therapy: Patient Spontanous Breathing  Post-op Assessment: Report given to PACU RN  Post vital signs: Reviewed and stable  Complications: No apparent anesthesia complications

## 2011-12-10 NOTE — Op Note (Signed)
And preop diagnosis previous cesarean section at term multiparity postop diagnosis repeat C-section and tubal ligation Anesthesia spinal Present assistant Dr. Coral Ceo Surgeon Dr. Francoise Ceo Procedure patient placed on the operating table in supine position after spinal and and is to abdomen prepped and draped bladder and to the Foley catheter a transverse suprapubic incision made through the old scar carried him to the rectus fascia fascia cleaned and incised the length of the incision recti muscles retracted laterally peritoneum incised longitudinally transverse incision made in the visceroperitoneum above the bladder bladder mobilized inferiorly transverse low uterine incision made the fluid was clear patient delivered from the OA position of a female Apgar 9 and 9 the team was in attendance the placenta was posterior more minute and sent to labor and delivery uterine cavity clean with dry laps uterine incision closed in one layer with continuous within normal on chromic hemostasis satisfactory bladder flap reattached to a chromic uterus well contracted tubes and ovaries normal the right tube GRAS in the midportion a Babcock clamp 0 plain suture placed in the nasal salpinx below the portion of tube and this was tied an approximately 1 inch of tube transected proceeded in a similar fashion incised lap and sponge counts correct abdomen chosen as peritoneum continuous with 2-0 chromic fascia continuous with of 0 Dexon skin shows a subcuticular stitch of 4-0 Monocryl blood loss 600 cc patient tolerated procedure well

## 2011-12-10 NOTE — Anesthesia Procedure Notes (Signed)

## 2011-12-10 NOTE — Transfer of Care (Deleted)
Immediate Anesthesia Transfer of Care Note  Patient: Leslie Mcdowell  Procedure(s) Performed: Procedure(s) (LRB): CESAREAN SECTION WITH BILATERAL TUBAL LIGATION (Bilateral)  Patient Location: PACU  Anesthesia Type: Spinal  Level of Consciousness: awake, alert  and oriented  Airway & Oxygen Therapy: Patient Spontanous Breathing  Post-op Assessment: Report given to PACU RN and Post -op Vital signs reviewed and stable  Post vital signs: stable  Complications: No apparent anesthesia complications

## 2011-12-10 NOTE — Anesthesia Postprocedure Evaluation (Signed)
Anesthesia Post Note  Patient: Leslie Mcdowell  Procedure(s) Performed: Procedure(s) (LRB): CESAREAN SECTION WITH BILATERAL TUBAL LIGATION (Bilateral)  Anesthesia type: Spinal  Patient location: PACU  Post pain: Pain level controlled  Post assessment: Post-op Vital signs reviewed  Last Vitals:  Filed Vitals:   12/10/11 0930  BP: 125/56  Pulse: 46  Temp: 36.5 C  Resp: 16    Post vital signs: Reviewed  Level of consciousness: awake  Complications: No apparent anesthesia complications

## 2011-12-10 NOTE — Addendum Note (Signed)
Addendum  created 12/10/11 1632 by Algis Greenhouse, CRNA   Modules edited:Notes Section

## 2011-12-10 NOTE — H&P (Signed)
  There has been no change in her history or physical exam in addition since yesterday

## 2011-12-11 LAB — CBC
MCH: 28.6 pg (ref 26.0–34.0)
MCV: 86.4 fL (ref 78.0–100.0)
Platelets: 330 10*3/uL (ref 150–400)
RBC: 3.98 MIL/uL (ref 3.87–5.11)

## 2011-12-11 NOTE — Progress Notes (Signed)
UR chart review completed.  

## 2011-12-11 NOTE — Progress Notes (Signed)
[facility] Department of Clinical Social Work Documentation of Interpretation   I assisted ___________________ with interpretation of _stopped by to check on patient_____________________ for this patient. 

## 2011-12-11 NOTE — Progress Notes (Signed)
Patient was referred for history of depression/anxiety. * Referral screened out by Clinical Social Worker because none of the following criteria appear to apply: ~ History of anxiety/depression during this pregnancy, or of post-partum depression. ~ Diagnosis of anxiety and/or depression within last 3 years ~ History of depression due to pregnancy loss/loss of child OR * Patient's symptoms currently being treated with medication and/or therapy. Please contact the Clinical Social Worker if needs arise, or if patient requests.  Chart notes that patient takes 50mg  of Zoloft daily.

## 2011-12-11 NOTE — Progress Notes (Signed)
Patient ID: Leslie Mcdowell, female   DOB: 1971/08/06, 41 y.o.   MRN: [id number] Postop day 1 Vital signs normal And fundus firm Legs negative No complaints

## 2011-12-12 NOTE — Progress Notes (Signed)
Patient ID: Leslie Mcdowell, female   DOB: [birth year], 41 y.o.   MRN: [id number] Postop day 2 Vital signs normal 1 Fundus firm time lochia moderate No complaints

## 2011-12-13 NOTE — Discharge Instructions (Signed)
Discharge instructions   You can wash your hair  Shower  Eat what you want  Drink what you want  See me in 6 weeks  Your ankles are going to swell more in the next 2 weeks than when pregnant  No sex for 6 weeks   Deepak Bless A, MD 12/13/2011    

## 2011-12-13 NOTE — Discharge Summary (Signed)
Obstetric Discharge Summary Reason for Admission: cesarean section Prenatal Procedures: none Intrapartum Procedures: cesarean: low cervical, transverse Postpartum Procedures: none Complications-Operative and Postpartum: none Hemoglobin  Date Value Range Status  12/11/2011 11.4* 12.0-15.0 (g/dL) Final     HCT  Date Value Range Status  12/11/2011 34.4* 36.0-46.0 (%) Final    Physical Exam:  General: alert Lochia: appropriate Uterine Fundus: firm Incision: healing well DVT Evaluation: No evidence of DVT seen on physical exam.  Discharge Diagnoses: Term Pregnancy-delivered  Discharge Information: Date: 12/13/2011 Activity: pelvic rest Diet: routine Medications: Percocet Condition: stable Instructions: refer to practice specific booklet Discharge to: home Follow-up Information    Follow up with Chiante Peden A, MD. Call in 6 weeks.   Contact information:   [address] [place] [address] [phone]          Newborn Data: Live born female  Birth Weight: 6 lb 5.5 oz (2878 g) APGAR: 9, 9  Home with mother.  Reggie Bise A 12/13/2011, 3:48 AM

## 2012-08-25 ENCOUNTER — Other Ambulatory Visit (HOSPITAL_COMMUNITY): Payer: Self-pay | Admitting: *Deleted

## 2014-04-26 ENCOUNTER — Other Ambulatory Visit (HOSPITAL_COMMUNITY): Payer: Self-pay | Admitting: Nurse Practitioner

## 2014-04-26 DIAGNOSIS — Z1231 Encounter for screening mammogram for malignant neoplasm of breast: Secondary | ICD-10-CM

## 2014-05-09 ENCOUNTER — Ambulatory Visit (HOSPITAL_COMMUNITY): Payer: Medicaid Other

## 2014-05-10 ENCOUNTER — Ambulatory Visit (HOSPITAL_COMMUNITY)
Admission: RE | Admit: 2014-05-10 | Discharge: 2014-05-10 | Disposition: A | Discharge status: Home / Self Care | Payer: Medicaid Other | Source: Ambulatory Visit | Attending: Nurse Practitioner | Admitting: Nurse Practitioner

## 2014-05-10 DIAGNOSIS — Z1231 Encounter for screening mammogram for malignant neoplasm of breast: Secondary | ICD-10-CM

## 2014-07-10 ENCOUNTER — Encounter (HOSPITAL_COMMUNITY): Payer: Self-pay | Admitting: *Deleted

## 2015-04-23 ENCOUNTER — Other Ambulatory Visit (HOSPITAL_COMMUNITY): Payer: Self-pay | Admitting: Family

## 2015-04-23 DIAGNOSIS — Z1231 Encounter for screening mammogram for malignant neoplasm of breast: Secondary | ICD-10-CM

## 2015-05-03 ENCOUNTER — Ambulatory Visit (HOSPITAL_COMMUNITY)
Admission: RE | Admit: 2015-05-03 | Discharge: 2015-05-03 | Disposition: A | Discharge status: Home / Self Care | Payer: Medicaid Other | Source: Ambulatory Visit | Attending: Family | Admitting: Family

## 2015-05-03 DIAGNOSIS — Z1231 Encounter for screening mammogram for malignant neoplasm of breast: Secondary | ICD-10-CM

## 2016-05-06 ENCOUNTER — Other Ambulatory Visit (HOSPITAL_COMMUNITY): Payer: Self-pay | Admitting: Family

## 2016-05-06 DIAGNOSIS — Z1231 Encounter for screening mammogram for malignant neoplasm of breast: Secondary | ICD-10-CM

## 2016-05-08 ENCOUNTER — Ambulatory Visit (HOSPITAL_COMMUNITY)
Admission: RE | Admit: 2016-05-08 | Discharge: 2016-05-08 | Disposition: A | Discharge status: Home / Self Care | Payer: Medicaid Other | Source: Ambulatory Visit | Attending: Family | Admitting: Family

## 2016-05-08 DIAGNOSIS — Z1231 Encounter for screening mammogram for malignant neoplasm of breast: Secondary | ICD-10-CM

## 2017-05-07 ENCOUNTER — Other Ambulatory Visit (HOSPITAL_COMMUNITY): Payer: Self-pay | Admitting: *Deleted

## 2017-05-07 DIAGNOSIS — Z1231 Encounter for screening mammogram for malignant neoplasm of breast: Secondary | ICD-10-CM

## 2017-05-14 ENCOUNTER — Ambulatory Visit (HOSPITAL_COMMUNITY)
Admission: RE | Admit: 2017-05-14 | Discharge: 2017-05-14 | Disposition: A | Discharge status: Home / Self Care | Payer: PRIVATE HEALTH INSURANCE | Source: Ambulatory Visit | Attending: *Deleted | Admitting: *Deleted

## 2017-05-14 DIAGNOSIS — Z1231 Encounter for screening mammogram for malignant neoplasm of breast: Secondary | ICD-10-CM | POA: Diagnosis present

## 2018-04-22 ENCOUNTER — Other Ambulatory Visit (HOSPITAL_COMMUNITY): Payer: Self-pay | Admitting: *Deleted

## 2018-04-22 DIAGNOSIS — Z1231 Encounter for screening mammogram for malignant neoplasm of breast: Secondary | ICD-10-CM

## 2018-05-03 ENCOUNTER — Ambulatory Visit (HOSPITAL_COMMUNITY): Payer: Self-pay

## 2018-05-17 ENCOUNTER — Encounter (HOSPITAL_COMMUNITY): Payer: Self-pay

## 2018-05-17 ENCOUNTER — Ambulatory Visit (HOSPITAL_COMMUNITY)
Admission: RE | Admit: 2018-05-17 | Discharge: 2018-05-17 | Disposition: A | Discharge status: Home / Self Care | Payer: PRIVATE HEALTH INSURANCE | Source: Ambulatory Visit | Attending: *Deleted | Admitting: *Deleted

## 2018-05-17 DIAGNOSIS — Z1231 Encounter for screening mammogram for malignant neoplasm of breast: Secondary | ICD-10-CM | POA: Insufficient documentation

## 2018-05-18 ENCOUNTER — Other Ambulatory Visit (HOSPITAL_COMMUNITY): Payer: Self-pay | Admitting: *Deleted

## 2018-05-18 DIAGNOSIS — R229 Localized swelling, mass and lump, unspecified: Principal | ICD-10-CM

## 2018-05-18 DIAGNOSIS — IMO0002 Reserved for concepts with insufficient information to code with codable children: Secondary | ICD-10-CM

## 2018-05-25 ENCOUNTER — Ambulatory Visit (HOSPITAL_COMMUNITY)
Admission: RE | Admit: 2018-05-25 | Discharge: 2018-05-25 | Disposition: A | Discharge status: Home / Self Care | Payer: PRIVATE HEALTH INSURANCE | Source: Ambulatory Visit | Attending: *Deleted | Admitting: *Deleted

## 2018-05-25 DIAGNOSIS — IMO0002 Reserved for concepts with insufficient information to code with codable children: Secondary | ICD-10-CM

## 2018-05-25 DIAGNOSIS — R229 Localized swelling, mass and lump, unspecified: Secondary | ICD-10-CM | POA: Insufficient documentation

## 2018-05-27 ENCOUNTER — Other Ambulatory Visit (HOSPITAL_COMMUNITY): Payer: Self-pay | Admitting: *Deleted

## 2018-05-27 DIAGNOSIS — R229 Localized swelling, mass and lump, unspecified: Principal | ICD-10-CM

## 2018-05-27 DIAGNOSIS — IMO0002 Reserved for concepts with insufficient information to code with codable children: Secondary | ICD-10-CM

## 2018-06-08 ENCOUNTER — Other Ambulatory Visit (HOSPITAL_COMMUNITY): Payer: Self-pay | Admitting: *Deleted

## 2018-06-08 ENCOUNTER — Encounter (HOSPITAL_COMMUNITY): Payer: Self-pay

## 2018-06-08 ENCOUNTER — Ambulatory Visit (HOSPITAL_COMMUNITY)
Admission: RE | Admit: 2018-06-08 | Discharge: 2018-06-08 | Disposition: A | Discharge status: Home / Self Care | Payer: PRIVATE HEALTH INSURANCE | Source: Ambulatory Visit | Attending: *Deleted | Admitting: *Deleted

## 2018-06-08 DIAGNOSIS — N62 Hypertrophy of breast: Secondary | ICD-10-CM | POA: Insufficient documentation

## 2018-06-08 DIAGNOSIS — R928 Other abnormal and inconclusive findings on diagnostic imaging of breast: Secondary | ICD-10-CM

## 2018-06-08 DIAGNOSIS — R229 Localized swelling, mass and lump, unspecified: Secondary | ICD-10-CM

## 2018-06-08 DIAGNOSIS — N6325 Unspecified lump in the left breast, overlapping quadrants: Secondary | ICD-10-CM | POA: Insufficient documentation

## 2018-06-08 DIAGNOSIS — IMO0002 Reserved for concepts with insufficient information to code with codable children: Secondary | ICD-10-CM

## 2018-06-08 MED ORDER — LIDOCAINE-EPINEPHRINE (PF) 1 %-1:200000 IJ SOLN
INTRAMUSCULAR | Status: AC
  Administered 2018-06-08: 5 mL
  Filled 2018-06-08: qty 30

## 2018-06-08 MED ORDER — SODIUM CHLORIDE 0.9% FLUSH
INTRAVENOUS | Status: AC
  Filled 2018-06-08: qty 10

## 2018-06-08 MED ORDER — LIDOCAINE HCL (PF) 1 % IJ SOLN
INTRAMUSCULAR | Status: AC
  Administered 2018-06-08: 5 mL
  Filled 2018-06-08: qty 5

## 2019-07-19 ENCOUNTER — Other Ambulatory Visit (HOSPITAL_COMMUNITY): Payer: Self-pay | Admitting: *Deleted

## 2019-07-19 DIAGNOSIS — Z1231 Encounter for screening mammogram for malignant neoplasm of breast: Secondary | ICD-10-CM

## 2019-08-11 ENCOUNTER — Other Ambulatory Visit: Payer: Self-pay

## 2019-08-11 ENCOUNTER — Ambulatory Visit (HOSPITAL_COMMUNITY)
Admission: RE | Admit: 2019-08-11 | Discharge: 2019-08-11 | Disposition: A | Discharge status: Home / Self Care | Payer: PRIVATE HEALTH INSURANCE | Source: Ambulatory Visit | Attending: *Deleted | Admitting: *Deleted

## 2019-08-11 DIAGNOSIS — Z1231 Encounter for screening mammogram for malignant neoplasm of breast: Secondary | ICD-10-CM | POA: Insufficient documentation

## 2020-08-14 ENCOUNTER — Other Ambulatory Visit (HOSPITAL_COMMUNITY): Payer: Self-pay | Admitting: *Deleted

## 2020-08-14 DIAGNOSIS — Z1231 Encounter for screening mammogram for malignant neoplasm of breast: Secondary | ICD-10-CM

## 2020-09-03 ENCOUNTER — Ambulatory Visit (HOSPITAL_COMMUNITY): Payer: PRIVATE HEALTH INSURANCE

## 2020-09-13 ENCOUNTER — Other Ambulatory Visit: Payer: Self-pay

## 2020-09-13 ENCOUNTER — Ambulatory Visit (HOSPITAL_COMMUNITY)
Admission: RE | Admit: 2020-09-13 | Discharge: 2020-09-13 | Disposition: A | Discharge status: Home / Self Care | Payer: PRIVATE HEALTH INSURANCE | Source: Ambulatory Visit | Attending: *Deleted | Admitting: *Deleted

## 2020-09-13 DIAGNOSIS — Z1231 Encounter for screening mammogram for malignant neoplasm of breast: Secondary | ICD-10-CM | POA: Insufficient documentation

## 2021-05-27 ENCOUNTER — Other Ambulatory Visit (HOSPITAL_COMMUNITY): Payer: Self-pay | Admitting: *Deleted

## 2021-05-27 DIAGNOSIS — R928 Other abnormal and inconclusive findings on diagnostic imaging of breast: Secondary | ICD-10-CM

## 2021-05-27 DIAGNOSIS — N63 Unspecified lump in unspecified breast: Secondary | ICD-10-CM

## 2021-06-04 ENCOUNTER — Other Ambulatory Visit: Payer: Self-pay

## 2021-06-04 ENCOUNTER — Ambulatory Visit (HOSPITAL_COMMUNITY)
Admission: RE | Admit: 2021-06-04 | Discharge: 2021-06-04 | Disposition: A | Discharge status: Home / Self Care | Payer: Self-pay | Source: Ambulatory Visit | Attending: *Deleted | Admitting: *Deleted

## 2021-06-04 DIAGNOSIS — R922 Inconclusive mammogram: Secondary | ICD-10-CM | POA: Diagnosis not present

## 2021-06-04 DIAGNOSIS — R928 Other abnormal and inconclusive findings on diagnostic imaging of breast: Secondary | ICD-10-CM | POA: Insufficient documentation

## 2021-06-11 ENCOUNTER — Encounter (HOSPITAL_COMMUNITY): Payer: Self-pay

## 2021-08-27 ENCOUNTER — Other Ambulatory Visit (HOSPITAL_COMMUNITY): Payer: Self-pay | Admitting: *Deleted

## 2021-08-27 DIAGNOSIS — Z1231 Encounter for screening mammogram for malignant neoplasm of breast: Secondary | ICD-10-CM

## 2021-09-23 ENCOUNTER — Other Ambulatory Visit: Payer: Self-pay

## 2021-09-23 ENCOUNTER — Ambulatory Visit (HOSPITAL_COMMUNITY)
Admission: RE | Admit: 2021-09-23 | Discharge: 2021-09-23 | Disposition: A | Discharge status: Home / Self Care | Payer: 59 | Source: Ambulatory Visit | Attending: *Deleted | Admitting: *Deleted

## 2021-09-23 DIAGNOSIS — Z1231 Encounter for screening mammogram for malignant neoplasm of breast: Secondary | ICD-10-CM | POA: Diagnosis not present

## 2021-09-26 ENCOUNTER — Other Ambulatory Visit (HOSPITAL_COMMUNITY): Payer: Self-pay | Admitting: *Deleted

## 2021-09-26 DIAGNOSIS — R928 Other abnormal and inconclusive findings on diagnostic imaging of breast: Secondary | ICD-10-CM

## 2021-10-01 ENCOUNTER — Ambulatory Visit (HOSPITAL_COMMUNITY)
Admission: RE | Admit: 2021-10-01 | Discharge: 2021-10-01 | Disposition: A | Discharge status: Home / Self Care | Payer: Self-pay | Source: Ambulatory Visit | Attending: *Deleted | Admitting: *Deleted

## 2021-10-01 ENCOUNTER — Other Ambulatory Visit: Payer: Self-pay

## 2021-10-01 DIAGNOSIS — R922 Inconclusive mammogram: Secondary | ICD-10-CM | POA: Diagnosis not present

## 2021-10-01 DIAGNOSIS — R928 Other abnormal and inconclusive findings on diagnostic imaging of breast: Secondary | ICD-10-CM | POA: Insufficient documentation

## 2022-07-04 ENCOUNTER — Encounter: Payer: Self-pay | Admitting: *Deleted

## 2022-08-08 ENCOUNTER — Other Ambulatory Visit (HOSPITAL_COMMUNITY): Payer: Self-pay | Admitting: *Deleted

## 2022-08-08 DIAGNOSIS — R1011 Right upper quadrant pain: Secondary | ICD-10-CM

## 2022-08-08 DIAGNOSIS — R229 Localized swelling, mass and lump, unspecified: Secondary | ICD-10-CM

## 2022-08-18 ENCOUNTER — Ambulatory Visit (HOSPITAL_COMMUNITY)
Admission: RE | Admit: 2022-08-18 | Discharge: 2022-08-18 | Disposition: A | Discharge status: Home / Self Care | Payer: 59 | Source: Ambulatory Visit | Attending: *Deleted | Admitting: *Deleted

## 2022-08-18 DIAGNOSIS — R229 Localized swelling, mass and lump, unspecified: Secondary | ICD-10-CM | POA: Diagnosis not present

## 2022-08-18 DIAGNOSIS — K8020 Calculus of gallbladder without cholecystitis without obstruction: Secondary | ICD-10-CM | POA: Diagnosis not present

## 2022-08-18 DIAGNOSIS — R1011 Right upper quadrant pain: Secondary | ICD-10-CM | POA: Diagnosis not present

## 2022-09-12 ENCOUNTER — Other Ambulatory Visit (HOSPITAL_COMMUNITY): Payer: Self-pay | Admitting: *Deleted

## 2022-09-12 DIAGNOSIS — R1901 Right upper quadrant abdominal swelling, mass and lump: Secondary | ICD-10-CM

## 2022-09-19 ENCOUNTER — Other Ambulatory Visit (HOSPITAL_COMMUNITY): Payer: Self-pay | Admitting: *Deleted

## 2022-09-19 ENCOUNTER — Ambulatory Visit (HOSPITAL_COMMUNITY)
Admission: RE | Admit: 2022-09-19 | Discharge: 2022-09-19 | Disposition: A | Discharge status: Home / Self Care | Payer: 59 | Source: Ambulatory Visit | Attending: *Deleted | Admitting: *Deleted

## 2022-09-19 DIAGNOSIS — R1901 Right upper quadrant abdominal swelling, mass and lump: Secondary | ICD-10-CM | POA: Diagnosis not present

## 2022-09-19 DIAGNOSIS — K8020 Calculus of gallbladder without cholecystitis without obstruction: Secondary | ICD-10-CM | POA: Diagnosis not present

## 2022-10-27 ENCOUNTER — Other Ambulatory Visit: Payer: Self-pay | Admitting: *Deleted

## 2022-10-27 ENCOUNTER — Telehealth: Payer: Self-pay

## 2022-10-27 DIAGNOSIS — Z1231 Encounter for screening mammogram for malignant neoplasm of breast: Secondary | ICD-10-CM

## 2022-11-10 ENCOUNTER — Ambulatory Visit (HOSPITAL_COMMUNITY)
Admission: RE | Admit: 2022-11-10 | Discharge: 2022-11-10 | Disposition: A | Discharge status: Home / Self Care | Payer: 59 | Source: Ambulatory Visit | Attending: *Deleted | Admitting: *Deleted

## 2022-11-10 ENCOUNTER — Encounter (HOSPITAL_COMMUNITY): Payer: Self-pay

## 2022-11-10 DIAGNOSIS — Z1231 Encounter for screening mammogram for malignant neoplasm of breast: Secondary | ICD-10-CM | POA: Diagnosis not present

## 2022-11-11 ENCOUNTER — Other Ambulatory Visit: Payer: Self-pay | Admitting: *Deleted

## 2022-11-11 DIAGNOSIS — Z1231 Encounter for screening mammogram for malignant neoplasm of breast: Secondary | ICD-10-CM

## 2022-12-08 ENCOUNTER — Encounter: Payer: Self-pay | Admitting: *Deleted

## 2023-03-06 ENCOUNTER — Telehealth: Payer: Self-pay

## 2023-03-06 NOTE — Telephone Encounter (Signed)
Called [facility] client for follow up after [facility] appointment this week. Interpreter services used [place] # [phone].  Client states she has been referred to surgeon and is waiting for a call for an appointment.  Discussed with her that she will need to submit for [facility] Financial assistance if approved to help with bills and visit and if surgery is needed. She states she thought she had done that with Mar[facility], reviewed notes and CAFA has not been submitted yet.  Discussed that I would ask Libby Maw care guide with [facility] to call her regarding next steps. Discussed that Fontaine No is not in the office today and would probably contact her Monday. She states understanding. Will notify Maricarmen of call and to please contact client for follow up regarding financial assistance.   Francee Nodal RN  [employer]

## 2023-03-30 ENCOUNTER — Other Ambulatory Visit: Payer: Self-pay | Admitting: *Deleted

## 2023-03-30 DIAGNOSIS — K8020 Calculus of gallbladder without cholecystitis without obstruction: Secondary | ICD-10-CM

## 2023-04-02 ENCOUNTER — Ambulatory Visit (INDEPENDENT_AMBULATORY_CARE_PROVIDER_SITE_OTHER): Payer: Self-pay | Admitting: General Surgery

## 2023-04-02 ENCOUNTER — Encounter: Payer: Self-pay | Admitting: General Surgery

## 2023-04-02 VITALS — BP 118/73 | HR 57 | Temp 98.3°F | Resp 12 | Ht 62.0 in | Wt 141.0 lb

## 2023-04-02 DIAGNOSIS — K8020 Calculus of gallbladder without cholecystitis without obstruction: Secondary | ICD-10-CM

## 2023-04-02 NOTE — Progress Notes (Signed)
Leslie Mcdowell [place]; [id number]; November 03, 1970   HPI Patient is a 52 year old Hispanic female who was referred to my care by the health department for evaluation and treatment of biliary colic secondary to cholelithiasis.  She has had intermittent episodes of right upper quadrant abdominal pain with fatty food intolerance, nausea, and the feeling of needing to vomit over the past few months.  She denies any fever, chills, or jaundice.  Her last episode was several weeks ago.  She was found on ultrasound of the gallbladder earlier this year to have cholelithiasis.  She currently is nervous about eating any fatty food as this may precipitate an episode of biliary colic.  An interpreter is present. Past Medical History:  Diagnosis Date   Depression    history   SAB (spontaneous abortion) 2008, 2009   x 2    Past Surgical History:  Procedure Laterality Date   CESAREAN SECTION  2006, 2011   x 2   DILATION AND CURETTAGE OF UTERUS     mab x 2    Family History  Problem Relation Age of Onset   Down syndrome Other        Lives in [place]; unknown which type of Down syndrome   Heart disease Sister     Current Outpatient Medications on File Prior to Visit  Medication Sig Dispense Refill   pantoprazole (PROTONIX) 40 MG tablet Take 40 mg by mouth daily.     simvastatin (ZOCOR) 20 MG tablet Take 20 mg by mouth daily.     No current facility-administered medications on file prior to visit.    No Known Allergies  Social History   Substance and Sexual Activity  Alcohol Use No    Social History   Tobacco Use  Smoking Status Never  Smokeless Tobacco Never    Review of Systems  Constitutional: Negative.   HENT: Negative.    Eyes: Negative.   Respiratory: Negative.    Cardiovascular: Negative.   Gastrointestinal: Negative.   Genitourinary: Negative.   Musculoskeletal: Negative.   Skin: Negative.   Neurological: Negative.   Endo/Heme/Allergies: Negative.    Psychiatric/Behavioral: Negative.      Objective   Vitals:   04/02/23 1018  BP: 118/73  Pulse: (!) 57  Resp: 12  Temp: 98.3 F (36.8 C)  SpO2: 98%    Physical Exam Vitals reviewed.  Constitutional:      Appearance: Normal appearance. She is normal weight. She is not ill-appearing.  HENT:     Head: Normocephalic and atraumatic.  Eyes:     General: No scleral icterus. Cardiovascular:     Rate and Rhythm: Normal rate and regular rhythm.     Heart sounds: Normal heart sounds. No murmur heard.    No friction rub. No gallop.  Pulmonary:     Effort: Pulmonary effort is normal. No respiratory distress.     Breath sounds: Normal breath sounds. No stridor. No wheezing, rhonchi or rales.  Abdominal:     General: Abdomen is flat. Bowel sounds are normal. There is no distension.     Palpations: Abdomen is soft. There is no mass.     Tenderness: There is no abdominal tenderness. There is no guarding or rebound.     Hernia: No hernia is present.     Comments: She states her pain is in the right upper quadrant to deep palpation where I palpate at Murphy's point.  Skin:    General: Skin is warm and dry.  Neurological:  Mental Status: She is alert and oriented to person, place, and time.    Primary care notes reviewed Assessment  Biliary colic, cholelithiasis Plan  Patient is scheduled for robotic assisted laparoscopic cholecystectomy on 04/15/2023.  The risks and benefits of the procedure including bleeding, infection, hepatobiliary injury, and the possibility of an open procedure were fully explained to the patient, who gave informed consent.

## 2023-04-07 NOTE — H&P (Signed)
Leslie Mcdowell [place]; [id number]; June 09, 1971   HPI Patient is a 52 year old Hispanic female who was referred to my care by the health department for evaluation and treatment of biliary colic secondary to cholelithiasis.  She has had intermittent episodes of right upper quadrant abdominal pain with fatty food intolerance, nausea, and the feeling of needing to vomit over the past few months.  She denies any fever, chills, or jaundice.  Her last episode was several weeks ago.  She was found on ultrasound of the gallbladder earlier this year to have cholelithiasis.  She currently is nervous about eating any fatty food as this may precipitate an episode of biliary colic.  An interpreter is present. Past Medical History:  Diagnosis Date   Depression    history   SAB (spontaneous abortion) 2008, 2009   x 2    Past Surgical History:  Procedure Laterality Date   CESAREAN SECTION  2006, 2011   x 2   DILATION AND CURETTAGE OF UTERUS     mab x 2    Family History  Problem Relation Age of Onset   Down syndrome Other        Lives in [place]; unknown which type of Down syndrome   Heart disease Sister     Current Outpatient Medications on File Prior to Visit  Medication Sig Dispense Refill   pantoprazole (PROTONIX) 40 MG tablet Take 40 mg by mouth daily.     simvastatin (ZOCOR) 20 MG tablet Take 20 mg by mouth daily.     No current facility-administered medications on file prior to visit.    No Known Allergies  Social History   Substance and Sexual Activity  Alcohol Use No    Social History   Tobacco Use  Smoking Status Never  Smokeless Tobacco Never    Review of Systems  Constitutional: Negative.   HENT: Negative.    Eyes: Negative.   Respiratory: Negative.    Cardiovascular: Negative.   Gastrointestinal: Negative.   Genitourinary: Negative.   Musculoskeletal: Negative.   Skin: Negative.   Neurological: Negative.   Endo/Heme/Allergies: Negative.    Psychiatric/Behavioral: Negative.      Objective   Vitals:   04/02/23 1018  BP: 118/73  Pulse: (!) 57  Resp: 12  Temp: 98.3 F (36.8 C)  SpO2: 98%    Physical Exam Vitals reviewed.  Constitutional:      Appearance: Normal appearance. She is normal weight. She is not ill-appearing.  HENT:     Head: Normocephalic and atraumatic.  Eyes:     General: No scleral icterus. Cardiovascular:     Rate and Rhythm: Normal rate and regular rhythm.     Heart sounds: Normal heart sounds. No murmur heard.    No friction rub. No gallop.  Pulmonary:     Effort: Pulmonary effort is normal. No respiratory distress.     Breath sounds: Normal breath sounds. No stridor. No wheezing, rhonchi or rales.  Abdominal:     General: Abdomen is flat. Bowel sounds are normal. There is no distension.     Palpations: Abdomen is soft. There is no mass.     Tenderness: There is no abdominal tenderness. There is no guarding or rebound.     Hernia: No hernia is present.     Comments: She states her pain is in the right upper quadrant to deep palpation where I palpate at Murphy's point.  Skin:    General: Skin is warm and dry.  Neurological:  Mental Status: She is alert and oriented to person, place, and time.    Primary care notes reviewed Assessment  Biliary colic, cholelithiasis Plan  Patient is scheduled for robotic assisted laparoscopic cholecystectomy on 04/15/2023.  The risks and benefits of the procedure including bleeding, infection, hepatobiliary injury, and the possibility of an open procedure were fully explained to the patient, who gave informed consent.

## 2023-04-13 ENCOUNTER — Encounter (HOSPITAL_COMMUNITY): Payer: Self-pay

## 2023-04-13 NOTE — Patient Instructions (Signed)
Instrucciones Para Antes de la Ciruga   Su ciruga est programada para-(your procedure is scheduled on) 04/15/2023 at 0600 AM.   [facility] - (Enter)    Por favor llame al [phone] si tiene algn problema en la maana de la ciruga. (Please call if you have any problems the morning of surgery.)                  Recuerde: (Remember)   No coma alimentos ni tome lquidos, incluyendo agua, despus de la medianoche del  (Do not eat food or drink liquids including water after midnight on 04/14/2023.   Tome estas medicinas en la maana de la ciruga con un SORBITO de agua (Take these meds the morning of surgery with a SIP of water)                                        pantoprazole.   Puede cepillarse los dientes en la maana de la [place]. (You may brush your teeth the morning of surgery)   No use joyas, maquillaje para los ojos, lpiz labial, locin corporal ni esmalte de uas oscuro, incluido el esmalte en gel, uas artificiales ni ningn otro tipo de [employer] uas naturales (dedos de manos y pies).  (Do not wear jewelry, eye makeup, lipstick, body lotion, or dark fingernail polish, including gel polish, artificial nails, or any other type of covering on natural nails (fingers and toes).   No puede usar desodorante. (You may wear deodorant)   Si va a ser ingresado despues de la ciruga, deje la maleta en el carro hasta que se le haya asignado una habitacin. (If you are to be admitted after surgery, leave suitcase in car until your room has been assigned.)   A los pacientes que se les d de alta el mismo da no se les permitir manejar a casa.  (Patients discharged on the day of surgery will not be allowed to drive home)   Use ropa suelta y cmoda de regreso a casa. (Wear loose comfortable clothes for ride home)    Colecistectoma mnimamente invasiva, cuidados posteriores Minimally Invasive Cholecystectomy, Care  After Qu puedo esperar despus del procedimiento? Despus del procedimiento, es comn [employer] siguientes sntomas: Financial risk analyst en las zonas de la [place]. Le darn medicamentos para Chief Technology Officer. Vomitar o tener nuseas. Sentir el vientre lleno (meteorismo) o Surveyor, mining en el hombro. Esto se debe al gas que se [place] durante la [place]. Siga estas instrucciones en su casa: Medicamentos Use los medicamentos de venta libre y los recetados solamente como se lo haya indicado el mdico. Si le recetaron un antibitico, tmelo como se lo haya indicado el mdico. No deje de tomarlo aunque comience a sentirse mejor. Si se lo indican, tome medidas a fin de prevenir problemas para ir de cuerpo (estreimiento). Es posible que deba hacer lo siguiente: Product manager suficiente lquido para Radio producer pis (la orina) de color amarillo plido. Tomar medicamentos. Le dirn qu medicamentos debe tomar. Comer alimentos ricos en fibra. Entre ellos, frijoles, cereales integrales y frutas y verduras frescas. Limitar los alimentos con alto contenido  de grasa y azcar. Estos incluyen alimentos fritos o dulces. Pregunte al mdico si debe evitar conducir o Chemical engineer mquinas mientras toma los medicamentos. Cuidado de la incisin  Siga las instrucciones del mdico en lo que respecta al cuidado de los cortes de la ciruga (incisiones). Asegrese de hacer lo siguiente: Lvese las manos con agua y jabn durante al menos 20 segundos antes y despus de cambiarse la venda (vendaje). Use un desinfectante para manos si no dispone de [place] y [place]. Cmbiese la venda. Deje los puntos (suturas) o la goma para cerrar la piel en su lugar durante al menos 2 semanas. Deje colocadas las tiras de [place] a menos que se le indique que se las quite. Puede recortar los bordes de las tiras de cinta si se enrollan. No tome baos de inmersin, no practique natacin ni utilice el jacuzzi. Pregunte a su mdico si puede ducharse o darse baos de  esponja. Controle la zona de la incisin todos los [address]  [address] para detectar signos de infeccin. Est atento a los siguientes signos: Aumento del enrojecimiento, la hinchazn o Chief Technology Officer. Lquido o sangre. Calor. Pus o mal olor. Actividad Haga reposo como se lo haya indicado el mdico. No realice actividades que requieran mucho esfuerzo. Levntese y camine un poco cada 1 a 2 horas. Pida ayuda si se siente dbil o inestable. No levante objetos que pesen ms de 10 libras (4.5 kg) o que sean ms pesados de lo que le indicaron. No practique deportes de contacto hasta que el mdico lo autorice. No retome el trabajo ni el estudio hasta que el mdico lo autorice. Retome sus actividades normales cuando el mdico le diga que es seguro. Instrucciones generales Si le administraron un sedante durante el procedimiento, no conduzca ni use mquinas hasta que el mdico le indique que es seguro [place]. Un sedante es un medicamento que ayuda a [place]. Concurra a todas las visitas de seguimiento. Comunquese con un mdico si: Aparece una erupcin cutnea. Aumentan el enrojecimiento, la hinchazn o el dolor alrededor de las incisiones. Le sale lquido o sangre de las incisiones. Las incisiones estn calientes al tacto. Tiene pus o percibe que sale mal olor del lugar de las incisiones. Tiene fiebre. Una o ms de las incisiones se abren. Solicite ayuda de inmediato si: Tiene dificultad para respirar. Siente dolor en el pecho. Siente dolor que empeora en la zona de los hombros. Se desmaya o se siente mareado al ponerse de pie. Tiene dolor muy intenso en el vientre (abdomen). Siente que va a vomitar o vomita y esto dura ms de Civil engineer, contracting. Siente dolor en la pierna. Estos sntomas pueden Customer service manager. Solicite ayuda de inmediato. Llame al 911. No espere a ver si los sntomas desaparecen. No conduzca por sus propios medios [employer]. Resumen Despus de la [place], es comn sentir dolor en las  zonas de la [place]. Tambin puede tener vmitos o sentir llenura en el vientre. Siga las instrucciones del mdico acerca de los medicamentos, la restriccin de actividades y el cuidado de las zonas de la [place]. No realice actividades que requieran mucho esfuerzo. Comunquese con el mdico si tiene fiebre u otros signos de infeccin, como ms enrojecimiento, hinchazn o dolor alrededor Microsoft. Busque ayuda de inmediato si tiene dolor de pecho, Engineer, mining en los hombros que va en aumento o problemas para Industrial/product designer. Esta informacin no tiene Theme park manager el consejo del mdico. Asegrese de hacerle al mdico cualquier pregunta que tenga. Document Revised: 03/13/2021 Document  Reviewed: 03/13/2021 Elsevier Patient Education  2024 Elsevier Inc. Janann August general en adultos, cuidados posteriores General Anesthesia, Adult, Care After La siguiente informacin ofrece orientacin sobre cmo cuidarse despus del procedimiento. El mdico tambin podr darle instrucciones ms especficas. Comunquese con el mdico si tiene problemas o preguntas. Qu puedo esperar despus del procedimiento? Despus del procedimiento, es normal sentir o tener lo siguiente: Dolor o Social worker de la va intravenosa (i.v.). Nuseas o vmitos. Dolor de Advertising copywriter o ronquera. Dificultad para concentrarse. Fro o escalofros. Debilidad, somnolencia o cansancio (fatiga). Malestar y Tourist information centre manager. Estos sntomas pueden afectar partes del cuerpo que no estuvieron involucradas en la ciruga. Siga estas indicaciones en su casa: Durante el perodo de [employer] le haya indicado el mdico:  Descanse. No participe en actividades que impliquen posibles cadas o lesiones. No conduzca ni opere maquinaria. No beba alcohol. No tome somnferos ni medicamentos que causen somnolencia. No tome decisiones trascendentes ni firme documentos importantes. No cuide a nios por su cuenta. Indicaciones generales [place]  suficiente lquido como para mantener la orina de color amarillo plido. Si tiene apnea del sueo, la [place] y ciertos medicamentos pueden elevar su riesgo de tener problemas respiratorios. Siga las instrucciones del mdico respecto al uso del dispositivo para dormir: Siempre que duerma, incluso durante las siestas que tome en [employer]. Mientras tome analgsicos recetados, medicamentos para dormir o medicamentos que producen somnolencia. Retome sus actividades normales segn lo indicado por el mdico. Pregntele al mdico qu actividades son seguras para usted. Use los medicamentos de venta libre y los recetados solamente como se lo haya indicado el mdico. No consuma ningn producto que contenga nicotina o tabaco. Estos productos incluyen cigarrillos, tabaco para Theatre manager y aparatos de vapeo, como los Administrator, Civil Service. Estos pueden retrasar la cicatrizacin de la incisin despus de la ciruga. Si necesita ayuda para dejar de consumir esos productos, consulte al mdico. Comunquese con un mdico si: Tiene nuseas o vmitos que no mejoran con medicamentos. Vomita cada vez que come o bebe. Su dolor no se alivia con medicamentos. No puede orinar o ve [employer]. Tiene una erupcin cutnea. Tiene fiebre. Solicite ayuda de inmediato si: Tiene dificultad para respirar. Siente dolor en el pecho. Vomita sangre. Estos sntomas pueden Customer service manager. Solicite ayuda de inmediato. Llame al 911. No espere a ver si los sntomas desaparecen. No conduzca por sus propios medios [employer]. Resumen Despus del procedimiento, es comn tener dolor de garganta, ronquera, nuseas, vmitos o sentir debilidad, somnolencia o fatiga. Durante el perodo de tiempo que le haya indicado el mdico, no conduzca ni use [place]. Busque ayuda de inmediato si le cuesta respirar, siente dolor en el pecho o vomita sangre. Estos sntomas pueden Customer service manager. Esta informacin no tiene Public house manager el consejo del mdico. Asegrese de hacerle al mdico cualquier pregunta que tenga. Document Revised: 12/19/2021 Document Reviewed: 12/19/2021 Elsevier Patient Education  2024 [employer].

## 2023-04-14 ENCOUNTER — Encounter (HOSPITAL_COMMUNITY)
Admission: RE | Admit: 2023-04-14 | Discharge: 2023-04-14 | Disposition: A | Discharge status: Home / Self Care | Payer: Self-pay | Source: Ambulatory Visit | Attending: General Surgery | Admitting: General Surgery

## 2023-04-14 VITALS — BP 118/73 | HR 58 | Temp 98.3°F | Resp 18 | Ht 62.0 in | Wt 141.0 lb

## 2023-04-14 DIAGNOSIS — Z01818 Encounter for other preprocedural examination: Secondary | ICD-10-CM | POA: Insufficient documentation

## 2023-04-14 LAB — CBC WITH DIFFERENTIAL/PLATELET
Abs Immature Granulocytes: 0.03 10*3/uL (ref 0.00–0.07)
Basophils Absolute: 0.1 10*3/uL (ref 0.0–0.1)
Basophils Relative: 1 %
Eosinophils Absolute: 0.3 10*3/uL (ref 0.0–0.5)
Eosinophils Relative: 2 %
HCT: 43.8 % (ref 36.0–46.0)
Hemoglobin: 14.5 g/dL (ref 12.0–15.0)
Immature Granulocytes: 0 %
Lymphocytes Relative: 30 %
Lymphs Abs: 3.6 10*3/uL (ref 0.7–4.0)
MCH: 29 pg (ref 26.0–34.0)
MCHC: 33.1 g/dL (ref 30.0–36.0)
MCV: 87.6 fL (ref 80.0–100.0)
Monocytes Absolute: 0.6 10*3/uL (ref 0.1–1.0)
Monocytes Relative: 5 %
Neutro Abs: 7.5 10*3/uL (ref 1.7–7.7)
Neutrophils Relative %: 62 %
Platelets: 409 10*3/uL — ABNORMAL HIGH (ref 150–400)
RBC: 5 MIL/uL (ref 3.87–5.11)
RDW: 12.9 % (ref 11.5–15.5)
WBC: 12.1 10*3/uL — ABNORMAL HIGH (ref 4.0–10.5)
nRBC: 0 % (ref 0.0–0.2)

## 2023-04-14 LAB — POCT PREGNANCY, URINE: Preg Test, Ur: NEGATIVE

## 2023-04-14 LAB — COMPREHENSIVE METABOLIC PANEL
ALT: 20 U/L (ref 0–44)
AST: 19 U/L (ref 15–41)
Albumin: 4.2 g/dL (ref 3.5–5.0)
Alkaline Phosphatase: 80 U/L (ref 38–126)
Anion gap: 10 (ref 5–15)
BUN: 11 mg/dL (ref 6–20)
CO2: 25 mmol/L (ref 22–32)
Calcium: 9.2 mg/dL (ref 8.9–10.3)
Chloride: 103 mmol/L (ref 98–111)
Creatinine, Ser: 0.62 mg/dL (ref 0.44–1.00)
GFR, Estimated: >60 mL/min (ref >60)
Glucose, Bld: 101 mg/dL — ABNORMAL HIGH (ref 70–99)
Potassium: 3.8 mmol/L (ref 3.5–5.1)
Sodium: 138 mmol/L (ref 135–145)
Total Bilirubin: 0.6 mg/dL (ref 0.3–1.2)
Total Protein: 8.3 g/dL — ABNORMAL HIGH (ref 6.5–8.1)

## 2023-04-14 NOTE — Progress Notes (Addendum)
Leslie Mcdowell / [facility] Spanish interpreter with patient and Patient's Daughter during PAT assessment and pre op with Dr Alva Garnet.  Consent obtained and all questions answered. Pre op instructions given and reviewed.

## 2023-04-14 NOTE — Anesthesia Preprocedure Evaluation (Addendum)
Anesthesia Evaluation  Patient identified by MRN, date of birth, ID band Patient awake    Reviewed: Allergy & Precautions, H&P , NPO status , Patient's Chart, lab work & pertinent test results  Airway Mallampati: II  TM Distance: >3 FB Neck ROM: Full    Dental  (+) Dental Advisory Given, Missing   Pulmonary neg pulmonary ROS   Pulmonary exam normal breath sounds clear to auscultation       Cardiovascular negative cardio ROS Normal cardiovascular exam Rhythm:Regular Rate:Normal     Neuro/Psych  PSYCHIATRIC DISORDERS  Depression    negative neurological ROS     GI/Hepatic Neg liver ROS,GERD  Medicated and Controlled,,  Endo/Other  negative endocrine ROS    Renal/GU negative Renal ROS  negative genitourinary   Musculoskeletal negative musculoskeletal ROS (+)    Abdominal   Peds negative pediatric ROS (+)  Hematology negative hematology ROS (+)   Anesthesia Other Findings   Reproductive/Obstetrics negative OB ROS                             Anesthesia Physical Anesthesia Plan  ASA: 2  Anesthesia Plan: General   Post-op Pain Management: Dilaudid IV   Induction: Intravenous  PONV Risk Score and Plan: 4 or greater and Ondansetron, Dexamethasone, Midazolam and Scopolamine patch - Pre-op  Airway Management Planned: Oral ETT  Additional Equipment:   Intra-op Plan:   Post-operative Plan: Extubation in OR  Informed Consent: I have reviewed the patients History and Physical, chart, labs and discussed the procedure including the risks, benefits and alternatives for the proposed anesthesia with the patient or authorized representative who has indicated his/her understanding and acceptance.     Dental advisory given and Interpreter used for interveiw  Plan Discussed with: Surgeon and CRNA  Anesthesia Plan Comments:        Anesthesia Quick Evaluation

## 2023-04-15 ENCOUNTER — Encounter (HOSPITAL_COMMUNITY)
Admission: RE | Disposition: A | Discharge status: Home / Self Care | Payer: Self-pay | Source: Home / Self Care | Attending: General Surgery

## 2023-04-15 ENCOUNTER — Ambulatory Visit (HOSPITAL_COMMUNITY)
Admission: RE | Admit: 2023-04-15 | Discharge: 2023-04-15 | Disposition: A | Discharge status: Home / Self Care | Payer: Self-pay | Attending: General Surgery | Admitting: General Surgery

## 2023-04-15 ENCOUNTER — Encounter (HOSPITAL_COMMUNITY): Payer: Self-pay | Admitting: General Surgery

## 2023-04-15 ENCOUNTER — Ambulatory Visit (HOSPITAL_BASED_OUTPATIENT_CLINIC_OR_DEPARTMENT_OTHER): Payer: Self-pay | Admitting: Anesthesiology

## 2023-04-15 ENCOUNTER — Ambulatory Visit (HOSPITAL_COMMUNITY): Payer: Self-pay | Admitting: Anesthesiology

## 2023-04-15 DIAGNOSIS — Z01818 Encounter for other preprocedural examination: Secondary | ICD-10-CM

## 2023-04-15 DIAGNOSIS — K8020 Calculus of gallbladder without cholecystitis without obstruction: Secondary | ICD-10-CM

## 2023-04-15 DIAGNOSIS — K8064 Calculus of gallbladder and bile duct with chronic cholecystitis without obstruction: Secondary | ICD-10-CM | POA: Insufficient documentation

## 2023-04-15 DIAGNOSIS — K8060 Calculus of gallbladder and bile duct with cholecystitis, unspecified, without obstruction: Secondary | ICD-10-CM

## 2023-04-15 SURGERY — CHOLECYSTECTOMY, ROBOT-ASSISTED, LAPAROSCOPIC
Anesthesia: General | Site: Abdomen

## 2023-04-15 MED ORDER — CHLORHEXIDINE GLUCONATE CLOTH 2 % EX PADS: 6.0000 | MEDICATED_PAD | Freq: Once | CUTANEOUS | Status: DC

## 2023-04-15 MED ORDER — ONDANSETRON HCL 4 MG/2ML IJ SOLN
INTRAMUSCULAR | Status: DC | PRN
  Administered 2023-04-15: 4 mg via INTRAVENOUS

## 2023-04-15 MED ORDER — PROPOFOL 10 MG/ML IV BOLUS
INTRAVENOUS | Status: AC
  Filled 2023-04-15: qty 20

## 2023-04-15 MED ORDER — HYDROMORPHONE HCL 1 MG/ML IJ SOLN
INTRAMUSCULAR | Status: DC | PRN
  Administered 2023-04-15 (×2): .5 mg via INTRAVENOUS

## 2023-04-15 MED ORDER — STERILE WATER FOR IRRIGATION IR SOLN
Status: DC | PRN
  Administered 2023-04-15: 500 mL

## 2023-04-15 MED ORDER — FENTANYL CITRATE (PF) 100 MCG/2ML IJ SOLN
INTRAMUSCULAR | Status: AC
  Filled 2023-04-15: qty 2

## 2023-04-15 MED ORDER — HEMOSTATIC AGENTS (NO CHARGE) OPTIME
TOPICAL | Status: DC | PRN
  Administered 2023-04-15: 1

## 2023-04-15 MED ORDER — ROCURONIUM BROMIDE 10 MG/ML (PF) SYRINGE
PREFILLED_SYRINGE | INTRAVENOUS | Status: AC
  Filled 2023-04-15: qty 10

## 2023-04-15 MED ORDER — SUGAMMADEX SODIUM 200 MG/2ML IV SOLN
INTRAVENOUS | Status: DC | PRN
  Administered 2023-04-15: 200 mg via INTRAVENOUS

## 2023-04-15 MED ORDER — MIDAZOLAM HCL 2 MG/2ML IJ SOLN
INTRAMUSCULAR | Status: DC | PRN
  Administered 2023-04-15: 2 mg via INTRAVENOUS

## 2023-04-15 MED ORDER — ONDANSETRON HCL 4 MG/2ML IJ SOLN
4.0000 mg | Freq: Once | INTRAMUSCULAR | Status: AC | PRN
  Administered 2023-04-15: 4 mg via INTRAVENOUS
  Filled 2023-04-15: qty 2

## 2023-04-15 MED ORDER — BUPIVACAINE HCL (PF) 0.5 % IJ SOLN
INTRAMUSCULAR | Status: DC | PRN
  Administered 2023-04-15: 30 mL

## 2023-04-15 MED ORDER — LIDOCAINE HCL (PF) 2 % IJ SOLN
INTRAMUSCULAR | Status: AC
  Filled 2023-04-15: qty 5

## 2023-04-15 MED ORDER — INDOCYANINE GREEN 25 MG IV SOLR
INTRAVENOUS | Status: AC
  Filled 2023-04-15: qty 10

## 2023-04-15 MED ORDER — ROCURONIUM BROMIDE 10 MG/ML (PF) SYRINGE
PREFILLED_SYRINGE | INTRAVENOUS | Status: DC | PRN
  Administered 2023-04-15 (×2): 10 mg via INTRAVENOUS
  Administered 2023-04-15: 50 mg via INTRAVENOUS

## 2023-04-15 MED ORDER — ONDANSETRON HCL 4 MG/2ML IJ SOLN
INTRAMUSCULAR | Status: AC
  Filled 2023-04-15: qty 2

## 2023-04-15 MED ORDER — LIDOCAINE HCL (CARDIAC) PF 100 MG/5ML IV SOSY
PREFILLED_SYRINGE | INTRAVENOUS | Status: DC | PRN
  Administered 2023-04-15: 80 mg via INTRATRACHEAL

## 2023-04-15 MED ORDER — CEFAZOLIN SODIUM-DEXTROSE 2-4 GM/100ML-% IV SOLN
INTRAVENOUS | Status: AC
  Filled 2023-04-15: qty 100

## 2023-04-15 MED ORDER — DEXAMETHASONE SODIUM PHOSPHATE 10 MG/ML IJ SOLN
INTRAMUSCULAR | Status: AC
  Filled 2023-04-15: qty 1

## 2023-04-15 MED ORDER — OXYCODONE HCL 5 MG PO TABS: 5.0000 mg | ORAL_TABLET | ORAL | 0 refills | Status: AC | PRN

## 2023-04-15 MED ORDER — PROPOFOL 10 MG/ML IV BOLUS
INTRAVENOUS | Status: DC | PRN
Start: 2023-04-15 — End: 2023-04-15
  Administered 2023-04-15: 180 mg via INTRAVENOUS

## 2023-04-15 MED ORDER — KETOROLAC TROMETHAMINE 30 MG/ML IJ SOLN
30.0000 mg | Freq: Once | INTRAMUSCULAR | Status: AC
  Administered 2023-04-15: 30 mg via INTRAVENOUS
  Filled 2023-04-15: qty 1

## 2023-04-15 MED ORDER — CHLORHEXIDINE GLUCONATE 0.12 % MT SOLN
15.0000 mL | Freq: Once | OROMUCOSAL | Status: AC
  Administered 2023-04-15: 15 mL via OROMUCOSAL

## 2023-04-15 MED ORDER — ORAL CARE MOUTH RINSE: 15.0000 mL | Freq: Once | OROMUCOSAL | Status: AC

## 2023-04-15 MED ORDER — BUPIVACAINE HCL (PF) 0.5 % IJ SOLN
INTRAMUSCULAR | Status: AC
  Filled 2023-04-15: qty 30

## 2023-04-15 MED ORDER — DEXAMETHASONE SODIUM PHOSPHATE 10 MG/ML IJ SOLN
INTRAMUSCULAR | Status: DC | PRN
  Administered 2023-04-15: 8 mg via INTRAVENOUS

## 2023-04-15 MED ORDER — FENTANYL CITRATE (PF) 100 MCG/2ML IJ SOLN
INTRAMUSCULAR | Status: DC | PRN
  Administered 2023-04-15 (×4): 50 ug via INTRAVENOUS

## 2023-04-15 MED ORDER — HYDROMORPHONE HCL 1 MG/ML IJ SOLN
INTRAMUSCULAR | Status: AC
  Filled 2023-04-15: qty 1

## 2023-04-15 MED ORDER — CEFAZOLIN SODIUM-DEXTROSE 2-4 GM/100ML-% IV SOLN
2.0000 g | INTRAVENOUS | Status: AC
  Administered 2023-04-15: 2 g via INTRAVENOUS

## 2023-04-15 MED ORDER — INDOCYANINE GREEN 25 MG IV SOLR: 2.5000 mg | Freq: Once | INTRAVENOUS | Status: AC

## 2023-04-15 MED ORDER — SODIUM CHLORIDE 0.9 % IR SOLN
Status: DC | PRN
  Administered 2023-04-15: 3000 mL

## 2023-04-15 MED ORDER — MIDAZOLAM HCL 2 MG/2ML IJ SOLN
INTRAMUSCULAR | Status: AC
  Filled 2023-04-15: qty 2

## 2023-04-15 MED ORDER — INDOCYANINE GREEN 25 MG IV SOLR
INTRAVENOUS | Status: AC
  Administered 2023-04-15: 2.5 mg via INTRAVENOUS
  Filled 2023-04-15: qty 10

## 2023-04-15 MED ORDER — LACTATED RINGERS IV SOLN: INTRAVENOUS | Status: DC

## 2023-04-15 MED ORDER — HYDROMORPHONE HCL 1 MG/ML IJ SOLN
0.2500 mg | INTRAMUSCULAR | Status: DC | PRN
  Administered 2023-04-15 (×3): 0.5 mg via INTRAVENOUS
  Filled 2023-04-15 (×3): qty 0.5

## 2023-04-15 MED ORDER — MEPERIDINE HCL 50 MG/ML IJ SOLN: 6.2500 mg | INTRAMUSCULAR | Status: DC | PRN

## 2023-04-15 SURGICAL SUPPLY — 40 items
ADH SKN CLS APL DERMABOND .7 (GAUZE/BANDAGES/DRESSINGS) ×1
APL ESCP 73.6OZ SRGCL (TIP) ×1
APL PRP STRL LF DISP 70% ISPRP (MISCELLANEOUS) ×1
CAUTERY HOOK MNPLR 1.6 DVNC XI (INSTRUMENTS) ×1 IMPLANT
CHLORAPREP W/TINT 26 (MISCELLANEOUS) ×1 IMPLANT
CLIP LIGATING HEM O LOK PURPLE (MISCELLANEOUS) ×1 IMPLANT
DERMABOND ADVANCED .7 DNX12 (GAUZE/BANDAGES/DRESSINGS) ×1 IMPLANT
DRAPE ARM DVNC X/XI (DISPOSABLE) ×4 IMPLANT
DRAPE COLUMN DVNC XI (DISPOSABLE) ×1 IMPLANT
ELECT REM PT RETURN 9FT ADLT (ELECTROSURGICAL) ×1
ELECTRODE REM PT RTRN 9FT ADLT (ELECTROSURGICAL) ×1 IMPLANT
FORCEPS BPLR R/ABLATION 8 DVNC (INSTRUMENTS) ×1 IMPLANT
FORCEPS PROGRASP DVNC XI (FORCEP) ×1 IMPLANT
GLOVE BIOGEL PI IND STRL 7.0 (GLOVE) ×2 IMPLANT
GLOVE SURG SS PI 7.5 STRL IVOR (GLOVE) ×2 IMPLANT
GOWN STRL REUS W/TWL LRG LVL3 (GOWN DISPOSABLE) ×3 IMPLANT
IRRIGATOR SUCT 8 DISP DVNC XI (IRRIGATION / IRRIGATOR) IMPLANT
IV NS IRRIG 3000ML ARTHROMATIC (IV SOLUTION) IMPLANT
KIT TURNOVER KIT A (KITS) ×1 IMPLANT
MANIFOLD NEPTUNE II (INSTRUMENTS) ×1 IMPLANT
NDL HYPO 21X1.5 SAFETY (NEEDLE) ×1 IMPLANT
NDL INSUFFLATION 14GA 120MM (NEEDLE) ×1 IMPLANT
NEEDLE HYPO 21X1.5 SAFETY (NEEDLE) ×1
NEEDLE INSUFFLATION 14GA 120MM (NEEDLE) ×1
OBTURATOR OPTICAL STND 8 DVNC (TROCAR) ×1
OBTURATOR OPTICALSTD 8 DVNC (TROCAR) ×1 IMPLANT
PACK LAP CHOLE LZT030E (CUSTOM PROCEDURE TRAY) ×1 IMPLANT
PAD ARMBOARD 7.5X6 YLW CONV (MISCELLANEOUS) ×1 IMPLANT
PENCIL HANDSWITCHING (ELECTRODE) ×1 IMPLANT
POSITIONER HEAD 8X9X4 ADT (SOFTGOODS) ×1 IMPLANT
POWDER SURGICEL 3.0 GRAM (HEMOSTASIS) IMPLANT
SEAL CANN UNIV 5-8 DVNC XI (MISCELLANEOUS) ×4 IMPLANT
SET TUBE DA VINCI INSUFFLATOR (TUBING) IMPLANT
SUT MNCRL AB 4-0 PS2 18 (SUTURE) ×2 IMPLANT
SUT VICRYL 0 AB UR-6 (SUTURE) ×1 IMPLANT
SYR 30ML LL (SYRINGE) ×1 IMPLANT
SYS RETRIEVAL 5MM INZII UNIV (BASKET) ×1
SYSTEM RETRIEVL 5MM INZII UNIV (BASKET) ×1 IMPLANT
TIP ENDOSCOPIC SURGICEL (TIP) IMPLANT
WATER STERILE IRR 500ML POUR (IV SOLUTION) ×1 IMPLANT

## 2023-04-15 NOTE — OR Nursing (Signed)
Interpreter used to communicate with patient. Roddie Mc [phone]

## 2023-04-15 NOTE — Anesthesia Procedure Notes (Signed)
Procedure Name: Intubation Date/Time: 04/15/2023 7:43 AM  Performed by: Oletha Cruel, CRNAPre-anesthesia Checklist: Patient identified, Emergency Drugs available, Suction available and Patient being monitored Patient Re-evaluated:Patient Re-evaluated prior to induction Oxygen Delivery Method: Circle system utilized Preoxygenation: Pre-oxygenation with 100% oxygen Induction Type: IV induction Ventilation: Mask ventilation without difficulty Laryngoscope Size: Mac and 4 Grade View: Grade II Tube type: Oral Tube size: 6.5 mm Number of attempts: 1 Airway Equipment and Method: Stylet Placement Confirmation: ETT inserted through vocal cords under direct vision, positive ETCO2, breath sounds checked- equal and bilateral and CO2 detector Secured at: 22 cm Tube secured with: Tape Dental Injury: Teeth and Oropharynx as per pre-operative assessment

## 2023-04-15 NOTE — Interval H&P Note (Signed)
History and Physical Interval Note:  04/15/2023 6:57 AM  Leslie Mcdowell Cleophas Dunker  has presented today for surgery, with the diagnosis of CHOLELITHIASIS.  The various methods of treatment have been discussed with the patient and family. After consideration of risks, benefits and other options for treatment, the patient has consented to  Procedure(s): XI ROBOTIC ASSISTED LAPAROSCOPIC CHOLECYSTECTOMY (N/A) as a surgical intervention.  The patient's history has been reviewed, patient examined, no change in status, stable for surgery.  I have reviewed the patient's chart and labs.  Questions were answered to the patient's satisfaction.     Franky Macho

## 2023-04-15 NOTE — Transfer of Care (Signed)
Immediate Anesthesia Transfer of Care Note  Patient: Leslie Mcdowell  Procedure(s) Performed: XI ROBOTIC ASSISTED LAPAROSCOPIC CHOLECYSTECTOMY (Abdomen)  Patient Location: PACU  Anesthesia Type:General  Level of Consciousness: drowsy and patient cooperative  Airway & Oxygen Therapy: Patient Spontanous Breathing and Patient connected to face mask oxygen  Post-op Assessment: Report given to RN and Post -op Vital signs reviewed and stable  Post vital signs: Reviewed and stable  Last Vitals:  Vitals Value Taken Time  BP 132/59 04/15/23 0937  Temp 98.5 04/15/23   0937  Pulse 64 04/15/23 0939  Resp 18 04/15/23 0939  SpO2 100 % 04/15/23 0939  Vitals shown include unfiled device data.  Last Pain:  Vitals:   04/15/23 0641  TempSrc: Oral  PainSc: 0-No pain         Complications: No notable events documented.

## 2023-04-15 NOTE — Anesthesia Postprocedure Evaluation (Signed)
Anesthesia Post Note  Patient: Leslie Mcdowell  Procedure(s) Performed: XI ROBOTIC ASSISTED LAPAROSCOPIC CHOLECYSTECTOMY (Abdomen)  Patient location during evaluation: Phase II Anesthesia Type: General Level of consciousness: awake and alert and oriented Pain management: pain level controlled Vital Signs Assessment: post-procedure vital signs reviewed and stable Respiratory status: spontaneous breathing, nonlabored ventilation and respiratory function stable Cardiovascular status: blood pressure returned to baseline and stable Postop Assessment: no apparent nausea or vomiting Anesthetic complications: no  No notable events documented.   Last Vitals:  Vitals:   04/15/23 1030 04/15/23 1040  BP:  (!) 150/63  Pulse: (!) 59 68  Resp: (!) 27 19  Temp:  36.9 C  SpO2: 93% 98%    Last Pain:  Vitals:   04/15/23 1040  TempSrc: Oral  PainSc: 5                  Shloima Clinch C Muriel Wilber

## 2023-04-15 NOTE — Op Note (Signed)
Patient:  Leslie Mcdowell  DOB:  1970/10/02  MRN:  [id number]   Preop Diagnosis: Biliary colic, cholelithiasis  Postop Diagnosis: Same  Procedure: Robotic assisted laparoscopic cholecystectomy  Surgeon: Franky Macho, MD  Anes: General Endotracheal  Indications: Patient is a 52 year old Hispanic female who presents with biliary colic secondary to cholelithiasis.  The risks and benefits of the procedure including bleeding, infection, hepatobiliary injury, and the possibility of an open procedure were fully explained to the patient, who gave informed consent.  Procedure note: The patient was placed in the supine position.  After induction of general endotracheal anesthesia, the abdomen was prepped and draped using the usual sterile technique with ChloraPrep.  Surgical site confirmation was performed.  An infraumbilical incision was made down to the fascia.  A Veress needle was introduced into the abdominal cavity and confirmation of placement was done using the saline drop test.  The abdomen was then insufflated to 15 mmHg pressure.  An 8 mm trocar was introduced into the abdominal cavity under direct visualization without difficulty.  The patient was placed in reverse Trendelenburg position and additional 8 mm trocar was placed in the left upper quadrant, right lower abdomen, and right flank regions.  The robot was then targeted and docked.  The gallbladder was inspected and noted to be tense with a thickened wall.  A large stone was noted in the infundibulum.  The gallbladder was decompressed in order to facilitate exposure.  The liver was within normal limits.  The gallbladder was retracted in a dynamic fashion in order to provide a critical view of the triangle of [place].  The cystic duct was first identified previous juncture to the infundibulum was fully identified.  The anatomy was confirmed by firefly.  Hem-o-lok clips were placed proximally and distally on the cystic duct and the  cystic duct was divided.  This was likewise done of the cystic artery.  The gallbladder was freed away from the gallbladder fossa using Bovie electrocautery.  The gallbladder was delivered through the left upper quadrant trocar site using an Endo Catch bag.  The gallbladder fossa was inspected and no bile leakage was noted.  Surgicel powder was placed in the gallbladder fossa.  All fluid and air were then evacuated from the abdominal cavity.  The robot was undocked and all trocars were removed.  All wounds were irrigated with normal saline.  All wounds were injected with 0.5% Sensorcaine.  The left upper quadrant fascia was reapproximated using an 0 Vicryl interrupted suture.  All skin incisions were closed using a 4-0 Monocryl subcuticular suture.  Dermabond was applied.  All tape and needle counts were correct at the end of the procedure.  The patient was extubated in the operating room and transferred to PACU in stable condition.  Complications: None  EBL: Minimal  Specimen: Gallbladder

## 2023-04-23 ENCOUNTER — Encounter: Payer: Self-pay | Admitting: General Surgery

## 2023-04-24 ENCOUNTER — Encounter: Payer: Self-pay | Admitting: General Surgery

## 2023-04-24 ENCOUNTER — Ambulatory Visit (INDEPENDENT_AMBULATORY_CARE_PROVIDER_SITE_OTHER): Payer: Self-pay | Admitting: General Surgery

## 2023-04-24 DIAGNOSIS — Z09 Encounter for follow-up examination after completed treatment for conditions other than malignant neoplasm: Secondary | ICD-10-CM

## 2023-04-24 DIAGNOSIS — K8020 Calculus of gallbladder without cholecystitis without obstruction: Secondary | ICD-10-CM

## 2023-04-24 NOTE — Progress Notes (Signed)
Subjective:     Leslie Mcdowell  Postoperative telephone visit performed with patient.  Patient was at home and I was in the hospital.  A medical student interpreter was used.  Patient states that she is doing better than she was preoperatively.  She is still having some mild incisional and back pain which is resolving with Tylenol. Objective:    There were no vitals taken for this visit.  General:  alert and cooperative  Final pathology consistent with diagnosis.     Assessment:    Doing well postoperatively.    Plan:   May increase activity as able.  May eat wherever she wants to.  She was [place] to continue her Nexium as needed.  Follow-up here as needed.  Total telephone time was 3-1/2 minutes.  As this was a part of the total global surgical fee, this was not a billable visit.

## 2023-10-27 IMAGING — US US BREAST*L* LIMITED INC AXILLA
1 series · 8 of 8 positions shown · non-contrast
Comparison: Previous exam(s).

CLINICAL DATA: Patient returns after screening exam for evaluation
of possible RIGHT breast asymmetry and possible LEFT breast mass.

EXAM:
DIGITAL DIAGNOSTIC BILATERAL MAMMOGRAM WITH TOMOSYNTHESIS AND CAD;
ULTRASOUND LEFT BREAST LIMITED
TECHNIQUE: Bilateral digital diagnostic mammography and breast tomosynthesis
was performed. The images were evaluated with computer-aided
detection.; Targeted ultrasound examination of the left breast was
performed.

[Series 1: us breast*left* limited inc axilla · 0.07mm/px · 8 of 8 slices shown]
[im 1/8]
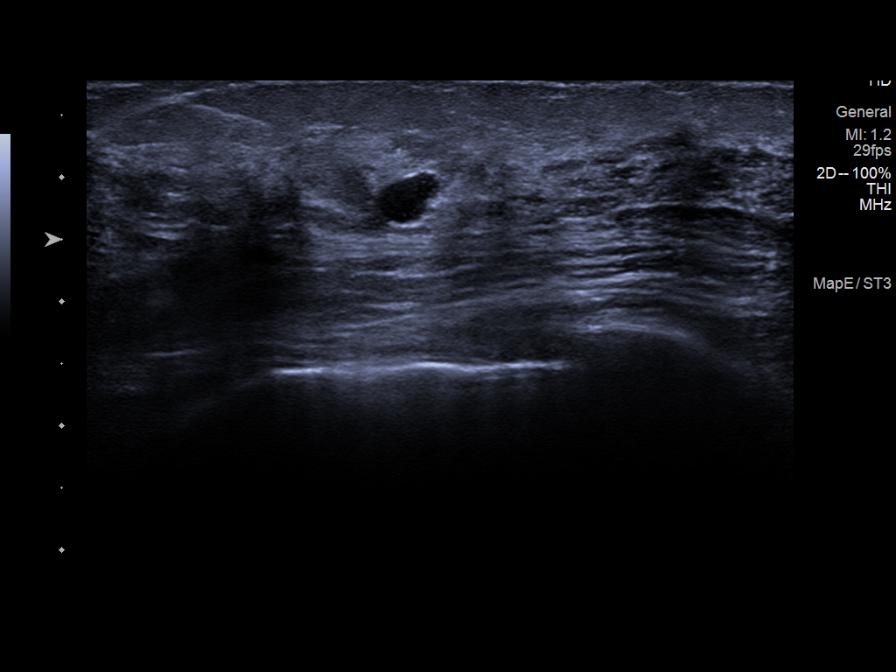
[im 2/8]
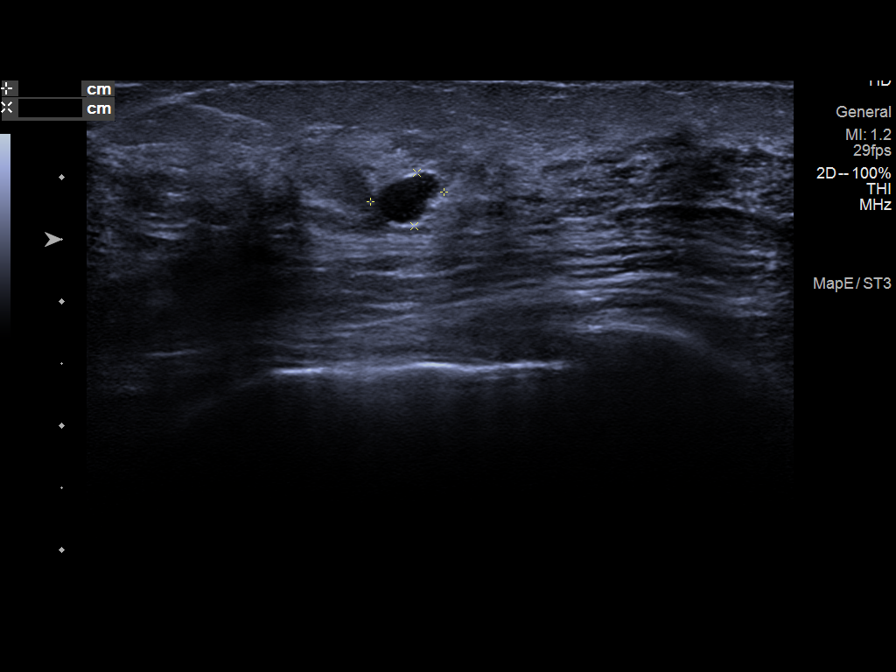
[im 3/8]
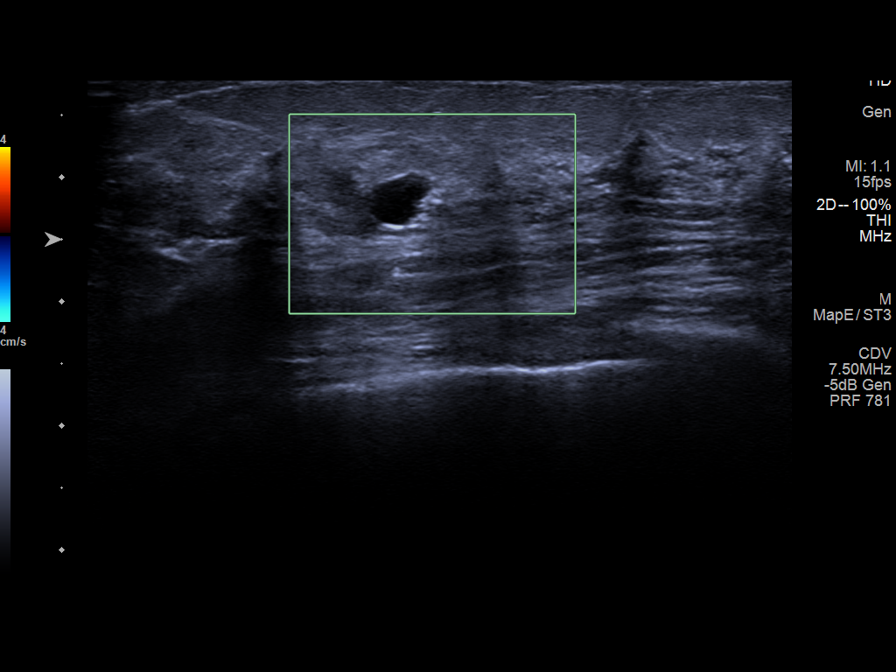
[im 4/8]
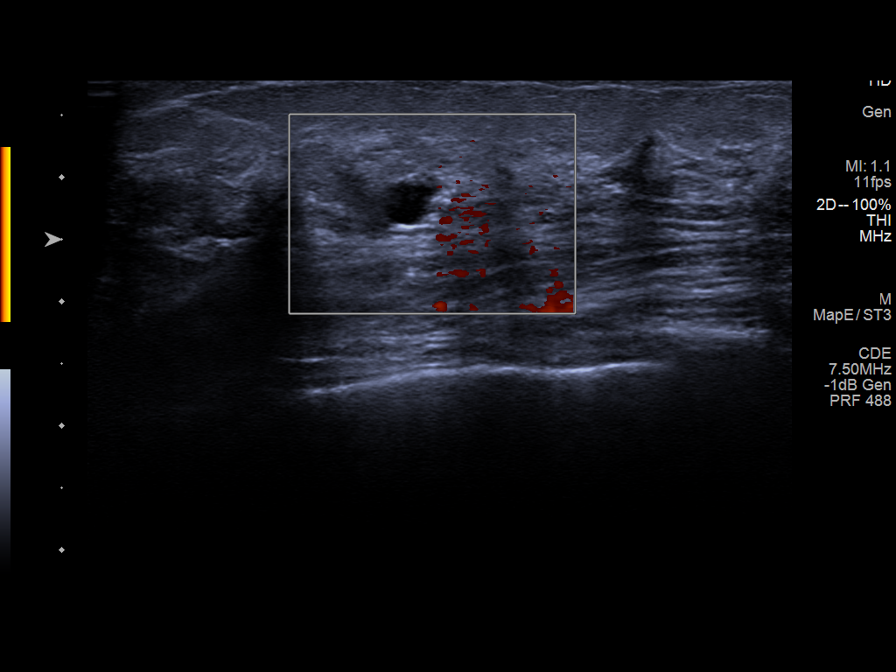
[im 5/8]
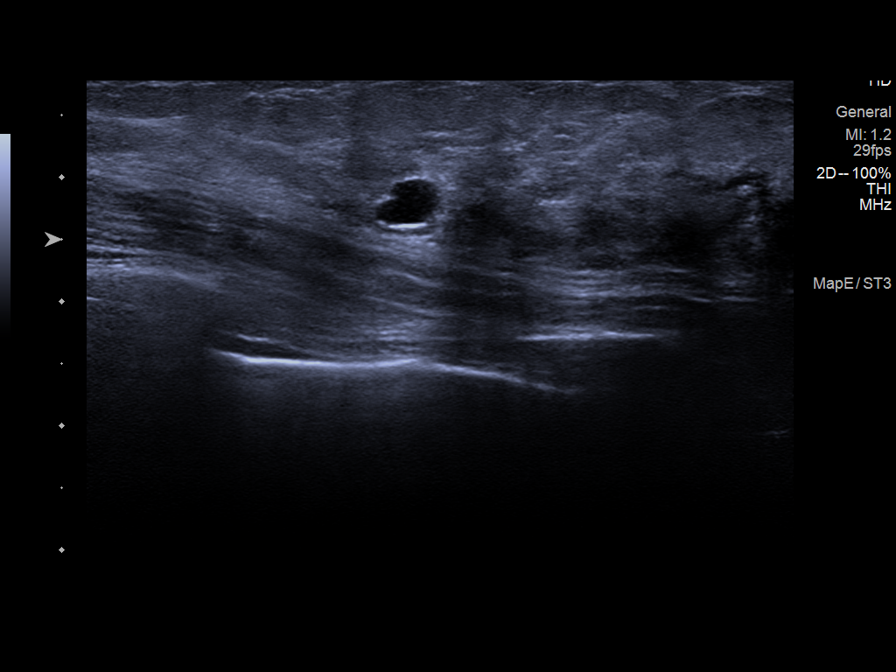
[im 6/8]
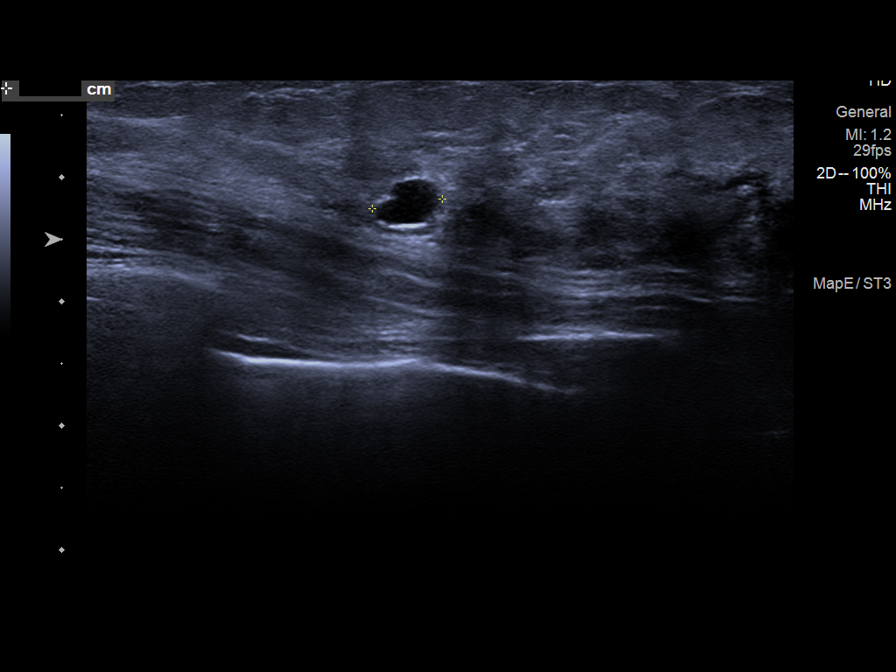
[im 7/8]
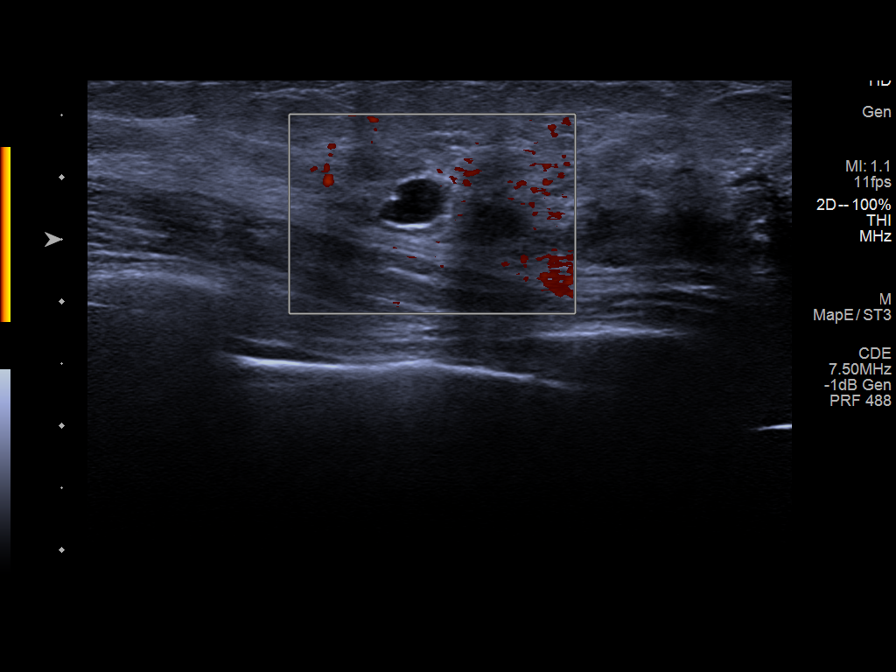
[im 8/8]
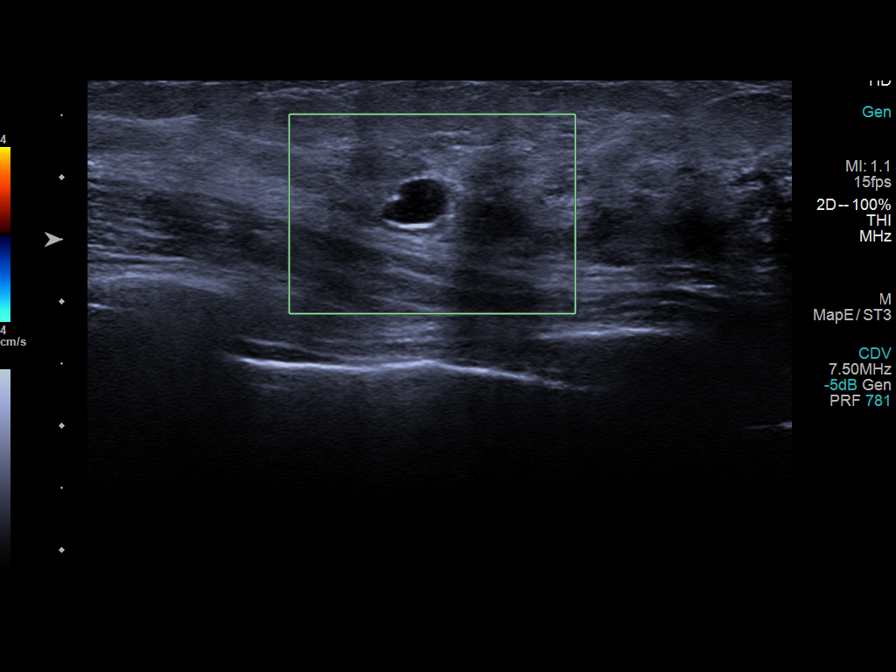

[8 of 8 positions shown; findings below may reference images not displayed]

ACR Breast Density Category c: The breast tissue is heterogeneously
dense, which may obscure small masses.
FINDINGS: RIGHT BREAST:

Mammogram: Additional 2-D and 3-D images are performed. These views
show no persistent asymmetry in the MEDIAL portion of the RIGHT
breast. Mammographic images were processed with CAD.

LEFT BREAST:

Mammogram: Additional 2-D and 3-D images are performed. These views
no definitive mass in the posterior central portion of the LEFT
breast. No definitive mass is identified in the UPPER portion of the
LEFT breast. Incidental note is made a circumscribed oval mass in
the 12 o'clock location of the LEFT breast and further evaluated
with ultrasound. Mammographic images were processed with CAD.

Ultrasound: Targeted ultrasound is performed, showing a simple cyst
in the 12 o'clock location of the LEFT breast 2 centimeters from the
nipple measuring 0.6 x 0.6 x 0 4 centimeters. No solid mass or areas
of acoustic shadowing.
IMPRESSION: 1. No persistent abnormality in the RIGHT breast.
2. Benign cyst in the LEFT breast.
3. No mammographic or ultrasound evidence for malignancy.

RECOMMENDATION:
Screening mammogram in one year.(Code:Y7-6-2JA)

I have discussed the findings and recommendations with the patient
with the assistance of an interpreter. If applicable, a reminder
letter will be sent to the patient regarding the next appointment.

BI-RADS CATEGORY  2: Benign.

## 2023-11-04 ENCOUNTER — Other Ambulatory Visit: Payer: Self-pay | Admitting: *Deleted

## 2023-11-04 DIAGNOSIS — Z1231 Encounter for screening mammogram for malignant neoplasm of breast: Secondary | ICD-10-CM

## 2023-11-16 ENCOUNTER — Ambulatory Visit (HOSPITAL_COMMUNITY)
Admission: RE | Admit: 2023-11-16 | Discharge: 2023-11-16 | Disposition: A | Discharge status: Home / Self Care | Payer: Self-pay | Source: Ambulatory Visit | Attending: *Deleted | Admitting: *Deleted

## 2023-11-16 DIAGNOSIS — Z1231 Encounter for screening mammogram for malignant neoplasm of breast: Secondary | ICD-10-CM | POA: Insufficient documentation

## 2023-11-25 ENCOUNTER — Other Ambulatory Visit (HOSPITAL_COMMUNITY): Payer: Self-pay | Admitting: Obstetrics and Gynecology

## 2023-11-25 ENCOUNTER — Encounter (HOSPITAL_COMMUNITY): Payer: Self-pay | Admitting: Obstetrics and Gynecology

## 2023-11-25 DIAGNOSIS — R928 Other abnormal and inconclusive findings on diagnostic imaging of breast: Secondary | ICD-10-CM

## 2023-12-04 ENCOUNTER — Inpatient Hospital Stay: Payer: Self-pay | Attending: Obstetrics and Gynecology | Admitting: Hematology and Oncology

## 2023-12-04 VITALS — BP 145/78 | Wt 144.9 lb

## 2023-12-04 DIAGNOSIS — N6489 Other specified disorders of breast: Secondary | ICD-10-CM

## 2023-12-04 NOTE — Progress Notes (Signed)
 [name] is a 53 y.o. female who presents to [facility] clinic today with no complaints. Follow up from screening mammogram for possible distortion in the right breast.   Pap Smear: Pap not smear completed today. Last Pap smear was 05/30/2021 at [facility] clinic and was normal. Per patient has no history of an abnormal Pap smear. Last Pap smear result is not available in Epic.   Physical exam: Breasts Breasts symmetrical. No skin abnormalities bilateral breasts. No nipple retraction bilateral breasts. No nipple discharge bilateral breasts. No lymphadenopathy. No lumps palpated bilateral breasts.       Pelvic/Bimanual Pap is not indicated today    Smoking History: Patient has never smoked and was not referred to quit line.    Patient Navigation: Patient education provided. Access to services provided for patient through [facility] program. [facility] interpreter provided. No transportation provided   Colorectal Cancer Screening: Per patient has never had colonoscopy completed No complaints today. Will be referred per [facility] once paperwork is completed.   Breast and Cervical Cancer Risk Assessment: Patient does not have family history of breast cancer, known genetic mutations, or radiation treatment to the chest before age 51. Patient does not have history of cervical dysplasia, immunocompromised, or DES exposure in-utero.  Risk Scores as of Encounter on 12/04/2023     Leslie Mcdowell           5-year 1.72%   Lifetime 12.4%   This patient is Hisp[place]/Latina but has no documented birth country, so the [employer] model used data from [place] patients to calculate their risk score. Document a birth country in the Demographics activity for a more accurate score.         Last calculated by Narda Rutherford, LPN on 12/15/8117 at 10:23 AM        A: [facility] exam without pap smear No complaints with benign exam. Previous screening mammogram results possible distortion in the right breast. Will proceed with  diagnostic testing.   P: Referred patient to [facility] for a diagnostic mammogram. Appointment scheduled 12/17/2023.  Pascal Lux, NP 12/04/2023 10:41 AM

## 2023-12-04 NOTE — Patient Instructions (Addendum)
 Taught Leslie Mcdowell about self breast awareness and gave educational materials to take home. Patient did not need a Pap smear today due to last Pap smear was in 05/30/2021  per patient. Told patient about free cervical cancer screenings to receive a Pap smear if would like one next year. Let her know [facility] will cover Pap smears every 5 years unless has a history of abnormal Pap smears. Referred patient to the [facility] for diagnostic mammogram. Appointment scheduled for 12/08/2023. Patient aware of appointment and will be there. Let patient know will follow up with her within the next couple weeks with results. Leslie Mcdowell verbalized understanding.  Pascal Lux, NP 10:09 AM

## 2023-12-17 ENCOUNTER — Encounter (HOSPITAL_COMMUNITY): Payer: Self-pay

## 2023-12-17 ENCOUNTER — Ambulatory Visit (HOSPITAL_COMMUNITY)
Admission: RE | Admit: 2023-12-17 | Discharge: 2023-12-17 | Disposition: A | Discharge status: Home / Self Care | Payer: Self-pay | Source: Ambulatory Visit | Attending: Obstetrics and Gynecology | Admitting: Obstetrics and Gynecology

## 2023-12-17 DIAGNOSIS — R928 Other abnormal and inconclusive findings on diagnostic imaging of breast: Secondary | ICD-10-CM

## 2024-02-16 ENCOUNTER — Telehealth: Payer: Self-pay

## 2024-02-16 NOTE — Telephone Encounter (Signed)
 Attempted follow up call with [facility] client with interpreter. No answer, left message requesting return call.  She is diagnosed with prediabetes and was last seen 11/04/23 No future appointment showing at this time.  Last A1C 5.8 on 04/21/23  Will mail Welcome letter from [facility] and prediabetes information. Know your numbers.  Kris Pester RN [employer]
